# Patient Record
Sex: Male | Born: 1945 | Race: Black or African American | Hispanic: No | Marital: Married | Smoking: Never smoker
Health system: Southern US, Community
[De-identification: ages and names within clinical notes are randomized; demographics above are authoritative.]

## PROBLEM LIST (undated history)

## (undated) DIAGNOSIS — I10 Essential (primary) hypertension: Secondary | ICD-10-CM

## (undated) DIAGNOSIS — N493 Fournier gangrene: Principal | ICD-10-CM

## (undated) DIAGNOSIS — L988 Other specified disorders of the skin and subcutaneous tissue: Secondary | ICD-10-CM

## (undated) DIAGNOSIS — H547 Unspecified visual loss: Secondary | ICD-10-CM

## (undated) HISTORY — DX: Unspecified visual loss: H54.7

## (undated) HISTORY — DX: Other specified disorders of the skin and subcutaneous tissue: L98.8

## (undated) HISTORY — PX: EYE SURGERY: SHX253

## (undated) HISTORY — DX: Essential (primary) hypertension: I10

## (undated) HISTORY — PX: HEMORRHOID SURGERY: SHX153

## (undated) HISTORY — DX: Fournier gangrene: N49.3

## (undated) HISTORY — PX: OTHER SURGICAL HISTORY: SHX169

## (undated) HISTORY — PX: RETINAL DETACHMENT SURGERY: SHX105

---

## 2010-12-15 ENCOUNTER — Ambulatory Visit (HOSPITAL_COMMUNITY): Admission: AD | Admit: 2010-12-15 | Payer: Self-pay | Source: Ambulatory Visit | Admitting: Surgery

## 2010-12-15 ENCOUNTER — Inpatient Hospital Stay (HOSPITAL_COMMUNITY)
Admission: EM | Admit: 2010-12-15 | Discharge: 2010-12-26 | DRG: 348 | Disposition: A | Discharge status: Home Health | Payer: Medicare Other | Attending: Pulmonary Disease | Admitting: Pulmonary Disease

## 2010-12-15 ENCOUNTER — Other Ambulatory Visit: Payer: Self-pay | Admitting: Surgery

## 2010-12-15 ENCOUNTER — Emergency Department (HOSPITAL_COMMUNITY): Payer: Medicare Other

## 2010-12-15 DIAGNOSIS — D6489 Other specified anemias: Secondary | ICD-10-CM | POA: Diagnosis not present

## 2010-12-15 DIAGNOSIS — E119 Type 2 diabetes mellitus without complications: Secondary | ICD-10-CM | POA: Diagnosis present

## 2010-12-15 DIAGNOSIS — E669 Obesity, unspecified: Secondary | ICD-10-CM | POA: Diagnosis present

## 2010-12-15 DIAGNOSIS — K612 Anorectal abscess: Principal | ICD-10-CM | POA: Diagnosis present

## 2010-12-15 DIAGNOSIS — N183 Chronic kidney disease, stage 3 unspecified: Secondary | ICD-10-CM | POA: Diagnosis present

## 2010-12-15 DIAGNOSIS — N501 Vascular disorders of male genital organs: Secondary | ICD-10-CM | POA: Diagnosis present

## 2010-12-15 DIAGNOSIS — H543 Unqualified visual loss, both eyes: Secondary | ICD-10-CM | POA: Diagnosis present

## 2010-12-15 DIAGNOSIS — I129 Hypertensive chronic kidney disease with stage 1 through stage 4 chronic kidney disease, or unspecified chronic kidney disease: Secondary | ICD-10-CM | POA: Diagnosis present

## 2010-12-15 DIAGNOSIS — N179 Acute kidney failure, unspecified: Secondary | ICD-10-CM | POA: Diagnosis present

## 2010-12-15 LAB — DIFFERENTIAL
Basophils Absolute: 0 10*3/uL (ref 0.0–0.1)
Basophils Relative: 0 % (ref 0–1)
Lymphocytes Relative: 4 % — ABNORMAL LOW (ref 12–46)
Monocytes Relative: 6 % (ref 3–12)
Neutro Abs: 23.9 10*3/uL — ABNORMAL HIGH (ref 1.7–7.7)
Neutrophils Relative %: 90 % — ABNORMAL HIGH (ref 43–77)

## 2010-12-15 LAB — HEPATIC FUNCTION PANEL
ALT: 33 U/L (ref 0–53)
AST: 35 U/L (ref 0–37)
Albumin: 2.4 g/dL — ABNORMAL LOW (ref 3.5–5.2)
Alkaline Phosphatase: 96 U/L (ref 39–117)
Indirect Bilirubin: 0.2 mg/dL — ABNORMAL LOW (ref 0.3–0.9)
Total Protein: 6.5 g/dL (ref 6.0–8.3)

## 2010-12-15 LAB — COMPREHENSIVE METABOLIC PANEL
Albumin: 2.3 g/dL — ABNORMAL LOW (ref 3.5–5.2)
Alkaline Phosphatase: 102 U/L (ref 39–117)
BUN: 39 mg/dL — ABNORMAL HIGH (ref 6–23)
CO2: 23 mEq/L (ref 19–32)
Chloride: 88 mEq/L — ABNORMAL LOW (ref 96–112)
Creatinine, Ser: 2.34 mg/dL — ABNORMAL HIGH (ref 0.4–1.5)
GFR calc non Af Amer: 28 mL/min — ABNORMAL LOW (ref >60)
Potassium: 3.6 mEq/L (ref 3.5–5.1)
Total Bilirubin: 0.5 mg/dL (ref 0.3–1.2)

## 2010-12-15 LAB — BASIC METABOLIC PANEL
Chloride: 86 mEq/L — ABNORMAL LOW (ref 96–112)
GFR calc Af Amer: 29 mL/min — ABNORMAL LOW (ref >60)
GFR calc non Af Amer: 24 mL/min — ABNORMAL LOW (ref >60)
Potassium: 3.5 mEq/L (ref 3.5–5.1)
Sodium: 126 mEq/L — ABNORMAL LOW (ref 135–145)

## 2010-12-15 LAB — LACTIC ACID, PLASMA: Lactic Acid, Venous: 6.3 mmol/L — ABNORMAL HIGH (ref 0.5–2.2)

## 2010-12-15 LAB — CBC
HCT: 35.3 % — ABNORMAL LOW (ref 39.0–52.0)
Hemoglobin: 12.3 g/dL — ABNORMAL LOW (ref 13.0–17.0)
MCHC: 34.8 g/dL (ref 30.0–36.0)
RBC: 4.45 MIL/uL (ref 4.22–5.81)

## 2010-12-15 LAB — URINALYSIS, ROUTINE W REFLEX MICROSCOPIC
Glucose, UA: 100 mg/dL — AB
Nitrite: NEGATIVE
Specific Gravity, Urine: 1.022 (ref 1.005–1.030)
pH: 5 (ref 5.0–8.0)

## 2010-12-15 LAB — GLUCOSE, CAPILLARY: Glucose-Capillary: 262 mg/dL — ABNORMAL HIGH (ref 70–99)

## 2010-12-16 LAB — GLUCOSE, CAPILLARY: Glucose-Capillary: 254 mg/dL — ABNORMAL HIGH (ref 70–99)

## 2010-12-16 LAB — CBC
HCT: 30.3 % — ABNORMAL LOW (ref 39.0–52.0)
MCH: 27.1 pg (ref 26.0–34.0)
MCV: 78.1 fL (ref 78.0–100.0)
RBC: 3.88 MIL/uL — ABNORMAL LOW (ref 4.22–5.81)
RDW: 13.5 % (ref 11.5–15.5)
WBC: 23.1 10*3/uL — ABNORMAL HIGH (ref 4.0–10.5)

## 2010-12-16 LAB — BASIC METABOLIC PANEL
BUN: 42 mg/dL — ABNORMAL HIGH (ref 6–23)
CO2: 23 mEq/L (ref 19–32)
Glucose, Bld: 207 mg/dL — ABNORMAL HIGH (ref 70–99)
Potassium: 3.6 mEq/L (ref 3.5–5.1)
Sodium: 126 mEq/L — ABNORMAL LOW (ref 135–145)

## 2010-12-17 LAB — GLUCOSE, CAPILLARY
Glucose-Capillary: 156 mg/dL — ABNORMAL HIGH (ref 70–99)
Glucose-Capillary: 193 mg/dL — ABNORMAL HIGH (ref 70–99)
Glucose-Capillary: 211 mg/dL — ABNORMAL HIGH (ref 70–99)
Glucose-Capillary: 223 mg/dL — ABNORMAL HIGH (ref 70–99)

## 2010-12-17 LAB — URINE CULTURE
Colony Count: NO GROWTH
Culture  Setup Time: 201205050056

## 2010-12-17 LAB — BASIC METABOLIC PANEL
Chloride: 100 mEq/L (ref 96–112)
Creatinine, Ser: 1.48 mg/dL (ref 0.4–1.5)
GFR calc Af Amer: 58 mL/min — ABNORMAL LOW (ref >60)
Potassium: 3.7 mEq/L (ref 3.5–5.1)

## 2010-12-17 LAB — CBC
Platelets: 240 10*3/uL (ref 150–400)
RBC: 3.76 MIL/uL — ABNORMAL LOW (ref 4.22–5.81)
WBC: 16 10*3/uL — ABNORMAL HIGH (ref 4.0–10.5)

## 2010-12-18 LAB — GLUCOSE, CAPILLARY
Glucose-Capillary: 248 mg/dL — ABNORMAL HIGH (ref 70–99)
Glucose-Capillary: 206 mg/dL — ABNORMAL HIGH (ref 70–99)

## 2010-12-19 LAB — CREATININE, SERUM
Creatinine, Ser: 1.51 mg/dL — ABNORMAL HIGH (ref 0.4–1.5)
GFR calc non Af Amer: 47 mL/min — ABNORMAL LOW (ref >60)

## 2010-12-19 LAB — CULTURE, ROUTINE-ABSCESS
Culture: NORMAL
Gram Stain: NONE SEEN

## 2010-12-19 LAB — GLUCOSE, CAPILLARY: Glucose-Capillary: 201 mg/dL — ABNORMAL HIGH (ref 70–99)

## 2010-12-19 LAB — VANCOMYCIN, TROUGH: Vancomycin Tr: 23.3 ug/mL — ABNORMAL HIGH (ref 10.0–20.0)

## 2010-12-19 NOTE — Op Note (Signed)
  NAME:  Jose Roberts, Jose Roberts NO.:  [id number]  MEDICAL RECORD NO.:  [id number]           PATIENT TYPE:  I  LOCATION:  1523                         FACILITY:  [facility]  PHYSICIAN:  Sandria Bales. Ezzard Standing, M.D.  DATE OF BIRTH:  Jan 28, 1946  DATE OF PROCEDURE:  12/18/2010                              OPERATIVE REPORT   PREOPERATIVE DIAGNOSIS:  Perirectal/perianal abscess - Fournier gangrene.  POSTOPERATIVE DIAGNOSIS:  Perirectal/perianal abscess - Fournier gangrene.  PROCEDURE:  Incision and drainage of abscess.  SURGEON:  Sandria Bales. Ezzard Standing, MD  ANESTHESIA:  8 cc of 2% Xylocaine.  COMPLICATIONS:  None.  INDICATIONS FOR PROCEDURE:  Jose Roberts is a 65 year old black male who has diabetes, presented with a perianal/buttocks abscess (Fournier gangrene) and required an incision and drainage by Dr. Michaell Cowing on Dec 15, 2010.  He is now at [facility].  He still has some areas that need to be opened up more widely and I discussed this with him.  We will do this at the bedside.  OPERATIVE NOTE: With the patient in prone position, his perineum was prepped with Betadine solution.  I infiltrated about 8 cc to 2% Xylocaine.  I opened on the right side a tissue bridge that was about 3-4 cm long and on the left side, I extended an incision more anterior.  He does have a connection between the left side which is a smaller incision to the large right side incision with a bridging skin posteriorly, but I left this alone for now.  I then packed it with saline gauze and will continue his daily dressing changes at bedside.  Sandria Bales. Ezzard Standing, M.D., FACS   DHN/MEDQ  D:  12/18/2010  T:  12/19/2010  Job:  [id number]  cc:   Dr. Mart Piggs  Electronically Signed by Ovidio Kin M.D. on 12/19/2010 10:30:24 AM

## 2010-12-20 LAB — ANAEROBIC CULTURE

## 2010-12-20 LAB — GLUCOSE, CAPILLARY
Glucose-Capillary: 203 mg/dL — ABNORMAL HIGH (ref 70–99)
Glucose-Capillary: 121 mg/dL — ABNORMAL HIGH (ref 70–99)
Glucose-Capillary: 91 mg/dL (ref 70–99)

## 2010-12-20 NOTE — Op Note (Signed)
  NAME:  Jose Roberts, Jose Roberts NO.:  [id number]  MEDICAL RECORD NO.:  [id number]           PATIENT TYPE:  I  LOCATION:  1523                         FACILITY:  [facility]  PHYSICIAN:  Sandria Bales. Ezzard Standing, M.D.  DATE OF BIRTH:  Oct 01, 1945  DATE OF PROCEDURE: 19 Dec 2010                              OPERATIVE REPORT   PREOPERATIVE DIAGNOSIS:  Perirectal and buttocks abscess/Fournier gangrene.  POSTOPERATIVE DIAGNOSIS:  Perirectal and buttocks abscess/Fournier gangrene.  PROCEDURE:  I and D of abscess.  SURGEON:  Sandria Bales. Ezzard Standing, M.D.  ANESTHESIA:  9 cc of 2% Xylocaine.  COMPLICATIONS:  None.  INDICATIONS FOR PROCEDURE:  Mr. Baranowski is a 65 year old black male who sees Dr. Loyal Jacobson, who was admitted by Dr. Karie Soda on May 4 and required an incision and drainage of a perirectal buttocks abscess. This looks like a Fournier gangrene.  He still has some tunneling under some of the flaps, so at the bedside I am going to do wider incision and drainage of this abscess.  OPERATIVE NOTE:  The patient in prone position.  His perineum was prepped with Betadine solution and infiltrated about 9 cc of 2% Xylocaine.  Along the left buttocks wound, I opened an area about another 4 cm going cranial and tried to clean up some of this tissue.  I then packed this with saline gauze.  The patient tolerated the procedure well.  His wife was in the room when I did this, and she has nursing background and understands the significance of the infection.   Sandria Bales. Ezzard Standing, M.D., FACS  DHN/MEDQ  D:  12/19/2010  T:  12/20/2010  Job:  [id number]  cc:   Sid Falcon, M.D.  Electronically Signed by Ovidio Kin M.D. on 12/20/2010 11:40:14 AM

## 2010-12-21 LAB — CULTURE, BLOOD (ROUTINE X 2)
Culture  Setup Time: 201205041416
Culture: NO GROWTH

## 2010-12-21 LAB — GLUCOSE, CAPILLARY

## 2010-12-21 LAB — HEMOGLOBIN A1C
Hgb A1c MFr Bld: 8 % — ABNORMAL HIGH (ref <5.7)
Mean Plasma Glucose: 183 mg/dL — ABNORMAL HIGH (ref <117)

## 2010-12-22 LAB — BASIC METABOLIC PANEL
BUN: 15 mg/dL (ref 6–23)
Calcium: 9.3 mg/dL (ref 8.4–10.5)
Chloride: 100 mEq/L (ref 96–112)
Creatinine, Ser: 2 mg/dL — ABNORMAL HIGH (ref 0.4–1.5)
GFR calc Af Amer: 41 mL/min — ABNORMAL LOW (ref >60)
GFR calc non Af Amer: 34 mL/min — ABNORMAL LOW (ref >60)

## 2010-12-22 LAB — DIFFERENTIAL
Basophils Absolute: 0 10*3/uL (ref 0.0–0.1)
Eosinophils Absolute: 0.1 10*3/uL (ref 0.0–0.7)
Lymphs Abs: 1.7 10*3/uL (ref 0.7–4.0)
Monocytes Relative: 9 % (ref 3–12)

## 2010-12-22 LAB — CBC
MCH: 27.3 pg (ref 26.0–34.0)
MCHC: 33.8 g/dL (ref 30.0–36.0)
Platelets: 360 10*3/uL (ref 150–400)
RBC: 2.64 MIL/uL — ABNORMAL LOW (ref 4.22–5.81)
RDW: 14 % (ref 11.5–15.5)

## 2010-12-22 LAB — GLUCOSE, CAPILLARY: Glucose-Capillary: 145 mg/dL — ABNORMAL HIGH (ref 70–99)

## 2010-12-23 LAB — BASIC METABOLIC PANEL
CO2: 23 mEq/L (ref 19–32)
Calcium: 9.1 mg/dL (ref 8.4–10.5)
Creatinine, Ser: 2.11 mg/dL — ABNORMAL HIGH (ref 0.4–1.5)
GFR calc Af Amer: 39 mL/min — ABNORMAL LOW (ref >60)
GFR calc non Af Amer: 32 mL/min — ABNORMAL LOW (ref >60)

## 2010-12-23 LAB — GLUCOSE, CAPILLARY
Glucose-Capillary: 137 mg/dL — ABNORMAL HIGH (ref 70–99)
Glucose-Capillary: 79 mg/dL (ref 70–99)

## 2010-12-23 LAB — CBC
Hemoglobin: 7.4 g/dL — ABNORMAL LOW (ref 13.0–17.0)
MCH: 26.7 pg (ref 26.0–34.0)
MCHC: 33.2 g/dL (ref 30.0–36.0)
Platelets: 477 10*3/uL — ABNORMAL HIGH (ref 150–400)
RDW: 14 % (ref 11.5–15.5)

## 2010-12-24 LAB — GLUCOSE, CAPILLARY
Glucose-Capillary: 131 mg/dL — ABNORMAL HIGH (ref 70–99)
Glucose-Capillary: 119 mg/dL — ABNORMAL HIGH (ref 70–99)

## 2010-12-24 LAB — BASIC METABOLIC PANEL
CO2: 25 mEq/L (ref 19–32)
Calcium: 9.4 mg/dL (ref 8.4–10.5)
GFR calc Af Amer: 40 mL/min — ABNORMAL LOW (ref >60)
GFR calc non Af Amer: 33 mL/min — ABNORMAL LOW (ref >60)
Sodium: 135 mEq/L (ref 135–145)

## 2010-12-24 LAB — CBC
Hemoglobin: 7.1 g/dL — ABNORMAL LOW (ref 13.0–17.0)
MCHC: 32.9 g/dL (ref 30.0–36.0)
RDW: 14.5 % (ref 11.5–15.5)

## 2010-12-25 LAB — BASIC METABOLIC PANEL
CO2: 23 mEq/L (ref 19–32)
Chloride: 99 mEq/L (ref 96–112)
GFR calc Af Amer: 39 mL/min — ABNORMAL LOW (ref >60)
Potassium: 4 mEq/L (ref 3.5–5.1)
Sodium: 133 mEq/L — ABNORMAL LOW (ref 135–145)

## 2010-12-26 LAB — BASIC METABOLIC PANEL
CO2: 23 mEq/L (ref 19–32)
Chloride: 100 mEq/L (ref 96–112)
Glucose, Bld: 219 mg/dL — ABNORMAL HIGH (ref 70–99)
Potassium: 3.8 mEq/L (ref 3.5–5.1)
Sodium: 133 mEq/L — ABNORMAL LOW (ref 135–145)

## 2010-12-26 NOTE — Op Note (Signed)
NAME:  Jose Roberts, FULTS NO.:  [id number]  MEDICAL RECORD NO.:  [id number]           PATIENT TYPE:  I  LOCATION:  1523                         FACILITY:  [facility]  PHYSICIAN:  Ardeth Sportsman, MD     DATE OF BIRTH:  04/10/1946  DATE OF PROCEDURE:  12/15/2010 DATE OF DISCHARGE:                              OPERATIVE REPORT   PRIMARY CARE PHYSICIAN:  Dr. Loyal Jacobson, [facility].  REFERRING PHYSICIAN:  Devoria Albe, M.D.  SURGEON:  Ardeth Sportsman, MD  PREOPERATIVE DIAGNOSES: 1. Large right perirectal abscess. 2. Possible Fournier gangrene.  POSTOPERATIVE DIAGNOSIS:  Fournier's gangrene.  PROCEDURE:  Incision and drainage of necrotic perineum into pelvis.  ANESTHESIA: 1. General anesthesia. 2. Local anesthetic and a field block.  SPECIMENS:  Necrotic skin, fat, muscle of perineum and buttocks for pathology as well as culture.  DRAINS:  None.  ESTIMATED BLOOD LOSS:  150 mL.  COMPLICATIONS:  None major.  INDICATIONS:  Jose Roberts is a 65 year old male, diabetic with a history of hemorrhoidectomies and rectal fistula.  These surgeries were in the 1990s.  He has not had any major problems.  However, 4 days ago, he started having anal pain.  He thought it was a  hemorrhoid flare.  He went over to [facility].  They recommended followup over [facility].  He went to [facility] 2 days later.  They thought the area may have burst.  They recommended that he see a surgeon in the following week.  Two days later (today), the pain became so intense that his wife brought him to the emergency room.  Based on evaluation today, they did x-rays and noted gas in his perineum and pelvis and recommended surgical evaluation.  We ordered a white count and it was markedly elevated at 26.  His lactate was elevated.  His glucose was elevated.   Surgery was consulted.  He had obvious involvement of the right half of his perineum to his gluteus coming up to his  scrotum with very foul smell.  We have recommended urgent IV antibiotics and surgical debridement. Technique was discussed.  Risks, benefits, and alternatives were discussed.  He and his wife agreed to proceed.  OPERATIVE FINDINGS:  He had extensive Fournier gangrene & necrosis of the skin and especially the fatty soft tissues of the right perineum. It tract up into the base of the right scrotum.  It tunneled along the right lateral sphincter muscle into the true pelvis on the right lateral aspect.  It also tunneled posteriorly onto the posterior buttocks and up towards the coccyx.  It also tract over from the right posterior to the left posterior inner buttocks.  The rectum seemed viable.  DESCRIPTION OF PROCEDURE:  Informed consent was confirmed.  The patient had already received IV vancomycin and piperacillin/tazobactam in the ER en route.  He underwent general anesthesia without any difficulty.  He was positioned high lithotomy.  His perineum was prepped and draped in sterile fashion.  Surgical time-out confirmed our plan.  I used an 18-gauge needle to try and aspirate frank pus, but I could not.  He already had a 5 x 5 cm area of black necrotic wet gangrene skin.  I took a scalpel and removed all of that.  We encountered extremely foul odor with gray brown necrotic fat that was seeping pus in numerous tissue planes.  I can bring my finger and easily sweep up to the base of the scrotum.  I can take my next finger and push all the way down the gluteus towards the coccyx.  I therefore extended the incision to about 15 cm to make sure everything was wide open.  I did a sharp dissection with Mayo scissors to remove all frank dead necrotic fat.  I trimmed back the skin and I took an extra centimeter of non-bleeding necrotic skin laterally and medially until I finally got a good bleeding skin as the skin was extremely edematous and ischemic and necrotic until it actually had bleeding  tissue.  I did further debridement up and towards the scrotum until I removed all dead tissue until I have some healthy bleeding fat and tissue.  In following it, there was necrotic tissue moving down into the pelvic floor muscles on the right, and I could easily put my fingers through there and encountered a deeper pocket more superior to that running along the right rectal wall into the true pelvis.  Fortunately, it is pus there but there was not any necrosis of the rectum itself.  I did track a little anteriorly as well, but I freed until I could debride all tissue.  Posteriorly, I could flip down and go into the true posterior buttocks and noted that I could go across midline in the intergluteal fold over the left side and released another large pocket.  Because these were rather posterior, I did counter incisions of 4 cm on the medial posterior buttocks and the mid-intergluteal cleft on both sides.  Using a speculum and going down about 10 cm deep, I could find some necrotic tissue and debrided that off.  I did copious irrigation with pulse lavage of over 3 L.  The areas had much pinked up.  There was healthy bleeding in all the bases.  I used cautery to help ensure hemostasis.  Therefore, I took a rolled gauze 4- inch Kerlix and put saline with light dose of Betadine and then packed all 3 open wounds.  Hemostasis was good.  The patient was extubated and taken to recovery room in stable condition.  I am about to discuss the operative findings to the patient's family. We will follow the wound closely.  He may require operative re- debridement in the next 24 to 72 hours depending how his clinical course goes.     Ardeth Sportsman, MD     SCG/MEDQ  D:  12/15/2010  T:  12/16/2010  Job:  [id number]  cc:   [facility]  [facility]  Electronically Signed by Karie Soda MD on 12/26/2010 12:25:51 PM

## 2011-01-23 NOTE — H&P (Signed)
NAME:  Jose Roberts, STREGE NO.:  [id number]  MEDICAL RECORD NO.:  [id number]           PATIENT TYPE:  E  LOCATION:  WLED                         FACILITY[facility]  [facility]  PHYSICIAN:  Ardeth Sportsman, MD     DATE OF BIRTH:  [birth year]  DATE OF ADMISSION:  12/15/2010 DATE OF DISCHARGE:                             HISTORY & PHYSICAL   CHIEF COMPLAINT:  Fever and rectal bleeding.  PRIMARY CARE DOCTOR:  Dr. Loyal Jacobson, [facility].  BRIEF HISTORY:  The patient is a 65 year old gentleman who has been blind since age 44.  He had some discomfort in his perirectal area back in Saturday and thought it was his hemorrhoids.  Monday p.m. by the time he left work, it was significantly worse and had the driver take him to a [facility].  From there, he was scheduled to see someone at [facility] on Wednesday p.m.  There, the patient reports that he has had a "burst."  He was seen and he was being scheduled to see a perirectal surgeon this coming Tuesday, Dec 19, 2010.  The patient has continued to have ongoing swelling and progressive discomfort.  It has got to the point where he cannot sit, it has gotten worse each day.  He got up at 5 a.m. and tried a sitz baths, developed nausea and vomiting, vomited 2 times and subsequently was brought to the ER by his wife.  PAST MEDICAL HISTORY: 1. He has been blind since age 20. 2. Diabetes. 3. Hypertension. 4. Overweight, height 71 inches, weights 261 pounds.  PAST SURGICAL HISTORY:  He had a rectal fistula about 16 years ago.  He has had hemorrhoids twice with some kind of revision by Dr. Lavell Islam about 1995.  He has had surgeries on his eyes in the past to remove cataracts but this did not help his vision.  FAMILY HISTORY:  Father died of coronary artery disease, diabetes, and hypertension in his 85s.  Mother died in their late 35s with cancer, diabetes, and high blood pressure.  He has 3 brothers, 5  sisters. Together they have multiple members have diabetes, some with high blood pressure and some with thyroid issues.  SOCIAL HISTORY:  Tobacco:  Never.  Alcohol:  8-10 beers per week at most, usually not that much.  Drugs:  None.  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  REVIEW OF SYSTEMS:  GENERAL:  Fever, he thinks he has had a fever but he has not recorded.  Weight, he says is stable. SKIN:  No changes except for his buttocks. PSYCH:  No changes. PULMONARY:  He had a cold symptoms last week.  Pulmonary is otherwise normal.  No coughing or wheezing.  No PND.  No dyspnea on exertion. CARDIAC:  No history of chest pain. GASTROINTESTINAL:  Positive for some GERD.  Positive for some constipation, it is why we have actually treated him with an enema on Wednesday and he had some laxatives yesterday.  He has not had a bowel movement since the enema on Wednesday. GENITOURINARY:  He describes difficulty  starting his stream.  This is ongoing for some time. LOWER EXTREMITIES:  No edema.  No claudication. MUSCULOSKELETAL:  No significant arthritis.  CURRENT MEDICATIONS:  Vicodin p.r.n., Bactrim 1 b.i.d., glipizide 10 mg daily, lisinopril 40 mg daily, Norvasc 10 mg daily, hydrochlorothiazide at one place is listed at 50 mg daily.  ALLERGIES:  None known.  PHYSICAL EXAM:  GENERAL:  This is a well nourished, overweight African American male, in no acute distress. VITAL SIGNS:  Temperature is 97.3 in the ER, his heart rate was 123 on admission, blood pressure 112/66, respiratory rate is 24, sats 99% on room air. HEENT:  Head:  Normocephalic.  Eyes:  He is blind bilaterally.  Ears, nose, throat, and mouth are grossly within normal limits. NECK:  Trachea is in the midline.  Thyroid was not palpable.  There are no bruits. CARDIAC:  Normal S1 and S2.  He is tachycardic.  Pulses are +2 and equal in upper and lower extremities. CHEST:  Nontender. ABDOMEN:  Soft, nontender.   Positive bowel sounds.  No hernia, masses, or abscesses. RECTAL:  A large indurated area which is swollen and raised around the rectum.  It is a Careers adviser.  It extends and goes towards the anterior perineum, especially on the right side.  On the right buttock, there is a large area about an inch and a half in diameter that was thought to be a cyst which is partially draining but was infected. SKIN:  Skin distal to this was normal in the buttocks.  I did not do a rectal exam secondary to tenderness.  No decubitus. NEUROLOGIC:  No focal deficits.  Patient is able to stand and moving without difficulty.  Cranial nerves except for his vision are grossly intact. PSYCH:  Normal affect.  LABORATORY DATA:  Labs so far, white count is 26,600, hemoglobin is 12.3, hematocrit 35, platelets 228,000, lactic acid is 6.3.  Three view abdomen shows gas and soft tissue in inferior pubic sepsis, possibly in the perineum and perirectal tissue.  At this point, he has been started on fluids and has IV vancomycin and Zosyn ordered.  IMPRESSION: 1. Perirectal infection, probable Fournier's Gangrene. 2. Blind. 3. Adult onset diabetes mellitus. 4. Hypertension. 5. History of hemorrhoids. 6. 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  PLAN:  We will hydrate the patient.  He has been started on antibiotics. More labs are pending.  We plan to take him emergently to the OR for incision and drainage with further treatment and debridement as needed.     Eber Hong, P.A.   ______________________________ Ardeth Sportsman, MD    WDJ/MEDQ  D:  12/15/2010  T:  12/15/2010  Job:  [id number]  cc:   Dr. Loyal Jacobson  Electronically Signed by Sherrie George P.A. on 12/31/2010 08:27:06 PM Electronically Signed by Karie Soda MD on 01/23/2011 03:51:04 PM

## 2011-02-14 NOTE — Consult Note (Signed)
NAME:  Jose Roberts, Jose Roberts NO.:  [id number]  MEDICAL RECORD NO.:  [id number]           PATIENT TYPE:  I  LOCATION:  1523                         [facility]AC[facility]  PHYSICIAN:  Zannie Cove, MD     DATE OF BIRTH:  [birth year]  DATE OF CONSULTATION: DATE OF DISCHARGE:                                CONSULTATION   PRIMARY CARE PHYSICIAN:  Dr. Loyal Jacobson, [facility].  REASON FOR CONSULTATION:  Uncontrolled diabetes.  PHYSICIAN REQUESTING THE CONSULT:  Sandria Bales. Ezzard Standing, MD  HISTORY OF PRESENT ILLNESS:  Mr. Pitera is a very pleasant 65 year old African American diabetic gentleman who was admitted to the surgical service with a perirectal abscess found to have Fournier's gangrene, is status post incision and drainage on Dec 19, 2010 and again repeat I and D on Dec 20, 2010.  Currently postop day #2, and being treated currently with IV vancomycin and Zosyn as well as hydrotherapy for his wound. [facility] hospitalist were consulted for his diabetes management, currently on Glucotrol in addition to this, he takes metformin 2000 mg at bedtime, but has never been on insulin previously except for a sliding scale over here.  However at this moment currently on a D5 normal saline drip at 60 mL an hour.  PAST MEDICAL HISTORY:  Significant for 1. Diabetes. 2. Hypertension. 3. History of hemorrhoids. 4. History of perirectal fistula. 5. Blindness. 6. Obesity.  PAST SURGICAL HISTORY:  Significant for hemorrhoid surgery in 1995 and cataract surgeries.  CURRENT MEDICATIONS:  In e-chart review include hydrochlorothiazide, glipizide, D5 half normal saline, lisinopril, Zosyn, vancomycin, simvastatin, MiraLax, Norvasc and other p.r.n. medications.  ALLERGIES:  No known drug allergies.  SOCIAL HISTORY:  Previously in an [employer].  No history of illicit drug use or tobacco use.  Drinks alcohol occasionally, several beers a  week.  FAMILY HISTORY:  Significant for coronary disease in his father and diabetes in his father as well.  Mother deceased in her 30s with cancer, diabetes and high blood pressure.  REVIEW OF SYSTEMS:  A 12-system review is negative except per HPI.  PHYSICAL EXAMINATION:  VITAL SIGNS:  Temperature is 98, pulse 94, blood pressure 143/76, respirations 18, satting 97% room air. GENERAL:  A very pleasant Caucasian gentleman lying in bed in no acute distress. HEENT:  Keeps his eyes close due to blindness.  Oral mucosa is moist and pink. NECK:  No JVD or lymphadenopathy. CARDIOVASCULAR SYSTEM:  S1, S2, regular rate and rhythm. LUNGS:  Clear to auscultation bilaterally. ABDOMEN:  Soft, nontender, positive bowel sounds.  No organomegaly. EXTREMITIES:  No edema, clubbing or cyanosis.  Rectal area with dressing.  ASSESSMENT AND PLAN:  Diabetes.  We will discontinue his D5 half normal saline and start him on half normal saline at 65.  Continue the Glucotrol for now.  We will check BMET, keep him on sensitive NovoLog sliding scale and check a hemoglobin A1c long-term.  He would probably not be a very good candidate for insulin due to the fact that he is blind; however, he may be able to hold off  on this on long-acting insulin if his sugars stabilize after stopping his dextrose.  When his kidney function remained stable and he is closer to discharge, we can also restart him back on his metformin.  We will follow the patient with you.  Thank you for the consult.     Zannie Cove, MD     PJ/MEDQ  D:  12/21/2010  T:  12/21/2010  Job:  [id number]  Electronically Signed by Zannie Cove  on 02/14/2011 02:12:46 PM

## 2011-02-19 ENCOUNTER — Ambulatory Visit (INDEPENDENT_AMBULATORY_CARE_PROVIDER_SITE_OTHER): Payer: Medicare Other | Admitting: Surgery

## 2011-02-19 ENCOUNTER — Encounter (INDEPENDENT_AMBULATORY_CARE_PROVIDER_SITE_OTHER): Payer: Self-pay | Admitting: Surgery

## 2011-02-19 DIAGNOSIS — N493 Fournier gangrene: Secondary | ICD-10-CM

## 2011-02-19 DIAGNOSIS — N501 Vascular disorders of male genital organs: Secondary | ICD-10-CM

## 2011-02-19 HISTORY — DX: Fournier gangrene: N49.3

## 2011-02-19 NOTE — Progress Notes (Signed)
Subjective:     Patient ID: Jose Roberts, male   DOB: 1946-03-19, 65 y.o.   MRN: [id number]    There were no vitals taken for this visit.    HPI  Diagnosis: Fournier gangrene  Procedures excision debridement on May 4/7/8, 2012  Reason for visit: Followup  Patient comes a feeling better. His wife is here. Continues dressing changes every day. His soreness is going down. His glucose has been in good control. He's not having problems with constipation or diarrhea.  Review of Systems  per HPI.    Objective:   Physical Exam  Constitutional: He appears well-developed and well-nourished.  Eyes:       Legally blind - unchanged  Neck: Normal range of motion.  Cardiovascular: Normal rate and intact distal pulses.   Pulmonary/Chest: Effort normal. No respiratory distress.  Abdominal: Soft. He exhibits no distension. There is no tenderness.  Genitourinary:       Left post perineal wound 5x1.5x1cm.  Right post perineal 8x2x2cm.  Great granulation, closing        Assessment:     Fournier's gangrene with numerous debridements, wound slowly healing.    Plan:     Continue daily dressing changes  9 Vioxx  Notes recurrence  Allowed to heal by secondary intention. Patient and wife wondered if I could just stitch the wound closed. NI warned him this could start the process all over again. Molli Knock to return to work after Labor Day. He has to sit for the entire day and work. At that point the wound should be closed.  Return to clinic in 4 weeks for wound check this

## 2011-02-19 NOTE — Patient Instructions (Signed)
Continue excellent wound care  Okay to wait until 04Sep2012 to return to work

## 2011-02-20 NOTE — Discharge Summary (Signed)
NAME:  Jose Roberts, FRASIER NO.:  [id number]  MEDICAL RECORD NO.:  [id number]           PATIENT TYPE:  I  LOCATION:  1523                         FACILITY:  [facility]  PHYSICIAN:  Lennie Muckle, MD      DATE OF BIRTH:  [birth year]  DATE OF ADMISSION:  12/15/2010 DATE OF DISCHARGE:  12/26/2010                              DISCHARGE SUMMARY   HISTORY OF PRESENT ILLNESS:  Mr. 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  He presented with significant discomfort in the perirectal area and thought it was hemorrhoids.  However, this significantly worsened and apparently reported some spontaneous drainage.  Therefore, he presented to the emergency department as the patient became more febrile and actually developed some nausea and vomiting.  Upon presentation to the emergency department, workup including labs and imaging including were significantly abnormal.  His white blood cell count on admission was noted to 26,000, and the abdominal imaging showed gas in the soft tissues and the inferior pubic sepsis, proximally in the perineum and perirectal region.  The patient was admitted for IV antibiotics and probable surgical intervention.  SUMMARY OF HOSPITAL COURSE:  The patient was admitted on Dec 15, 2010, was immediately placed on IV vancomycin and Zosyn per pharmacy recommendations.  The patient's admission creatinine was quite elevated at 2.69.  However, with no prior admissions or records at this facility, unable to know the patient's baseline creatinine is.  The patient was taken to the operating room on October 5th, and underwent incision and drainage of necrotic perineum into the pelvis consistent with Fournier gangrene.  He was subsequently taken back on May 8th as well as on May 9th for repeated incision and drainage and packing change due to the complexity of the wound and toleration  of pain by the patient.  However, after the third procedure, the patient seemed to be significantly better as far as pain control and the tissue was much more viable.  Gradually the packing and the dressing changes were able to be done at bedside.  The physical therapist were able to continue using some hydrotherapy for additional debridement of the wound.  The patient's diabetes, however, was found to be uncontrolled during the course of the patient's stay.  His hemoglobin A1c was elevated at 8.0. Again no baseline labs as this patient was new to this facility. Therefore the [facility] were asked to consult on the patient and adjusted his medications.  Given his acute renal insufficiency secondary to the infectious course, the metformin was initially started but then discontinued.  He has also been taken off his lisinopril and ibuprofen as these were both thought to be contributing to his acute renal insufficiency in addition to the infectious process.  He had been switched to Prandin 0.5 mg twice daily in addition to his glipizide which has provided good control while he is here.  In addition, he has been placed on a sliding scale as a precaution.  His creatinine has stabilized postoperatively to a level of approximately two  with a GFR ranging around 40.  Again uncertain whether this patient had some baseline renal insufficiency secondary to his diabetes, but he appears to be at baseline as his creatinine and GFR have been stable for several days, and he has maintained adequate and good urine output.  Therefore, we feel the patient is stable for discharge at this point in time with continued home health management.  DISCHARGE DIAGNOSES: 1. Fournier gangrene status post extensive debridement posteriorly to     bilateral buttock regions. 2. Acute on probable chronic renal insufficiency - stable. 3. Diabetes mellitus - stable. 4. Hypertension - stable.  DISCHARGE  MEDICATIONS:  The patient's home medications have altered and will now be as follows: 1. Prandin 0.5 mg 1 tablet twice daily. 2. Amlodipine 10 mg 1 tablet daily. 3. Aspirin 81 mg daily. 4. Glipizide 10 mg 1 tablet daily. 5. Hydrochlorothiazide 50 mg 1 tablet daily. 6. Lipitor 10 mg 1/2 tablet daily. 7. Multivitamin 1 tablet daily. 8. Cinnamon supplement 2 tablets daily once discharged home.  He will also be given prescription for: 1. Augmentin 875 one tablet twice daily. 2. Vicodin 0.2 tablets q.4h. p.r.n. severe pain or at dressing     changes.  DISCHARGE INSTRUCTIONS:  The patient will have home health arranged to do normal saline wet-to-dry packing, change of his perineum gluteal wounds.  He can change the outer dressing on a p.r.n. basis for any soilage or contamination.  He is instructed to follow up with his primary care physician, probably within the next 1 to 2 weeks to have some repeat labs drawn for his renal function and diabetes management. He was also instructed to contact Dr. Michaell Cowing at [facility] for followup in approximately 7 to 10 days.     Brayton El, PA-C   ______________________________ Lennie Muckle, MD    KB/MEDQ  D:  12/26/2010  T:  12/26/2010  Job:  [id number]  Electronically Signed by Brayton El  on 02/05/2011 02:31:45 PM Electronically Signed by Bertram Savin MD on 02/20/2011 11:40:41 AM

## 2011-03-29 ENCOUNTER — Encounter (INDEPENDENT_AMBULATORY_CARE_PROVIDER_SITE_OTHER): Payer: Self-pay | Admitting: Surgery

## 2011-03-29 ENCOUNTER — Ambulatory Visit (INDEPENDENT_AMBULATORY_CARE_PROVIDER_SITE_OTHER): Payer: Medicare Other | Admitting: Surgery

## 2011-03-29 VITALS — BP 140/96 | HR 80 | Temp 96.4°F | Ht 72.0 in | Wt 251.8 lb

## 2011-03-29 DIAGNOSIS — N501 Vascular disorders of male genital organs: Secondary | ICD-10-CM

## 2011-03-29 DIAGNOSIS — N493 Fournier gangrene: Secondary | ICD-10-CM

## 2011-03-29 NOTE — Patient Instructions (Signed)
Continue wound care with gentle washing/bathing & dry gauze on wounds.

## 2011-03-29 NOTE — Progress Notes (Signed)
Subjective:     Patient ID: Jose Roberts, male   DOB: Aug 01, 1946, 65 y.o.   MRN: [id number]    BP 140/96  Pulse 80  Temp(Src) 96.4 F (35.8 C) (Temporal)  Ht 6' (1.829 m)  Wt 251 lb 12.8 oz (114.216 kg)  BMI 34.15 kg/m2    HPI   Diagnosis: Fournier gangrene  Procedures excision debridement on May 4/7/8, 2012  Reason for visit: Followup  Patient comes a feeling better.  Continues dressing changes every day. His soreness is going down. His glucose has been in good control. He's not having problems with constipation or diarrhea.  Review of Systems   per HPI.    Objective:   Physical Exam  Constitutional: He appears well-developed and well-nourished.  Eyes:       Legally blind - unchanged  Neck: Normal range of motion.  Cardiovascular: Normal rate and intact distal pulses.   Pulmonary/Chest: Effort normal. No respiratory distress.  Abdominal: Soft. He exhibits no distension. There is no tenderness.  Genitourinary:       Left post perineal wound nearly closed.  Right post perineal 5x2x1cm.  Great granulation, closing        Assessment:     Fournier's gangrene with numerous debridements, wound slowly healing.    Plan:     Continue daily dressing changes  to heal by secondary intention.   Okay to return to work now.  Pt says he needs to get back to work sooner.  Return to clinic in 3 weeks for wound check.

## 2011-04-03 ENCOUNTER — Encounter (INDEPENDENT_AMBULATORY_CARE_PROVIDER_SITE_OTHER): Payer: Medicare Other | Admitting: Surgery

## 2011-04-19 ENCOUNTER — Encounter (INDEPENDENT_AMBULATORY_CARE_PROVIDER_SITE_OTHER): Payer: Medicare Other | Admitting: Surgery

## 2011-04-19 ENCOUNTER — Encounter (INDEPENDENT_AMBULATORY_CARE_PROVIDER_SITE_OTHER): Payer: Self-pay | Admitting: Surgery

## 2011-04-19 ENCOUNTER — Ambulatory Visit (INDEPENDENT_AMBULATORY_CARE_PROVIDER_SITE_OTHER): Payer: Medicare Other | Admitting: Surgery

## 2011-04-19 VITALS — BP 142/88 | HR 62 | Temp 97.0°F

## 2011-04-19 DIAGNOSIS — N501 Vascular disorders of male genital organs: Secondary | ICD-10-CM

## 2011-04-19 DIAGNOSIS — N493 Fournier gangrene: Secondary | ICD-10-CM

## 2011-04-19 NOTE — Patient Instructions (Signed)
Continue wound care with shower/bath every day & dry gauze.

## 2011-04-19 NOTE — Progress Notes (Signed)
Subjective:     Patient ID: Jose Roberts, male   DOB: Jan 08, 1946, 65 y.o.   MRN: [id number]    BP 142/88  Pulse 62  Temp 97 F (36.1 C)    HPI   Diagnosis: Fournier gangrene  Procedures excision debridement on May 4/7/8, 2012  Reason for visit: Followup  Patient comes today feeling better.  Wondering it it is Okay to bowl.  Continues dressing changes every day. His soreness is going down. His glucose has been in good control. He's not having problems with constipation or diarrhea.  Review of Systems   per HPI.    Objective:   Physical Exam  Constitutional: He appears well-developed and well-nourished.  Eyes:       Legally blind - unchanged  Neck: Normal range of motion.  Cardiovascular: Normal rate and intact distal pulses.   Pulmonary/Chest: Effort normal. No respiratory distress.  Abdominal: Soft. He exhibits no distension. There is no tenderness.  Genitourinary:       Left post perineal wound only 5mm.  Right post perineal 3x1x1cm.  Great granulation, closing        Assessment:     Fournier's gangrene with numerous debridements, wound slowly healing.    Plan:     Continue daily dressing changes  to heal by secondary intention.   Okay to return to regular unrestricted activities.   Return to clinic in 6 weeks for wound check.

## 2011-06-05 ENCOUNTER — Encounter (INDEPENDENT_AMBULATORY_CARE_PROVIDER_SITE_OTHER): Payer: Medicare Other | Admitting: General Surgery

## 2011-06-27 ENCOUNTER — Ambulatory Visit (INDEPENDENT_AMBULATORY_CARE_PROVIDER_SITE_OTHER): Payer: Medicare Other | Admitting: Surgery

## 2011-06-27 ENCOUNTER — Encounter (INDEPENDENT_AMBULATORY_CARE_PROVIDER_SITE_OTHER): Payer: Self-pay | Admitting: Surgery

## 2011-06-27 VITALS — BP 132/86 | HR 68 | Temp 97.5°F | Resp 16 | Ht 71.0 in | Wt 269.2 lb

## 2011-06-27 DIAGNOSIS — N501 Vascular disorders of male genital organs: Secondary | ICD-10-CM

## 2011-06-27 DIAGNOSIS — N493 Fournier gangrene: Secondary | ICD-10-CM

## 2011-06-27 NOTE — Patient Instructions (Signed)
Wound Care Wound care helps prevent pain and infection.  You may need a tetanus shot if:  You cannot remember when you had your last tetanus shot.   You have never had a tetanus shot.   The injury broke your skin.  If you need a tetanus shot and you choose not to have one, you may get tetanus. Sickness from tetanus can be serious. HOME CARE   Only take medicine as told by your doctor.   Clean the wound daily with mild soap and water.   Change any bandages (dressings) as told by your doctor.   Put medicated cream and a bandage on the wound as told by your doctor.   Change the bandage if it gets wet, dirty, or starts to smell.   Take showers/baths.   Rest and raise (elevate) the wound until the pain and puffiness (swelling) are better.   Keep all doctor visits as told.  GET HELP RIGHT AWAY IF:   Yellowish-white fluid (pus) comes from the wound.   Medicine does not lessen your pain.   There is a red streak going away from the wound.   You cannot move your finger or toe.   You have a fever.  MAKE SURE YOU:   Understand these instructions.   Will watch your condition.   Will get help right away if you are not doing well or get worse.  Document Released: 05/08/2008 Document Revised: 04/11/2011 Document Reviewed: 12/03/2010 [facility] Patient Information 2012 [place], [place].

## 2011-06-27 NOTE — Progress Notes (Signed)
Subjective:     Patient ID: Jose Roberts, male   DOB: 1946/01/25, 65 y.o.   MRN: [id number]  HPI  Patient Care Team: Sid Falcon as PCP - General (Family Medicine)  This patient is a 65 y.o.male who presents today for surgical evaluation.   Procedure: Incision and debridement of Fournier's gangrene  Patient comes in today feeling better. He is frustrated that the wound had not totally closed down. His wife is here today and continues daily dressing changes. He notes his hemoglobin A1c increased to 8. Normally around. Both he and his wife noted sugars are running in the 100s. No problems with diarrhea. No fevers or chills or sore  Past Medical History  Diagnosis Date  . Hypertension   . Diabetes mellitus   . Blind   . Gangrene   . Hemorrhoids   . Fistula     Past Surgical History  Procedure Date  . Surgery on gangrene May 2012 x 2    Fournier's Gangrene of perineum  . Retinal detachment surgery   . Hemorrhoid surgery     revision  . Eye surgery     History   Social History  . Marital Status: Married    Spouse Name: N/A    Number of Children: N/A  . Years of Education: N/A   Occupational History  . Not on file.   Social History Main Topics  . Smoking status: Never Smoker   . Smokeless tobacco: Never Used  . Alcohol Use: 0.0 oz/week    8-10 Cans of beer per week  . Drug Use: No  . Sexually Active: Not on file   Other Topics Concern  . Not on file   Social History Narrative  . No narrative on file    Family History  Problem Relation Age of Onset  . Diabetes Mother   . Hypertension Mother   . Cancer Mother     pancreas  . Diabetes Father   . Hypertension Father   . Diabetes Brother   . Diabetes Sister     Current outpatient prescriptions:amLODipine (NORVASC) 10 MG tablet, , Disp: , Rfl: ;  BOOSTRIX 5-2.5-18.5 injection, , Disp: , Rfl: ;  HYDROcodone-acetaminophen (NORCO) 5-325 MG per tablet, as needed., Disp: , Rfl: ;   lisinopril-hydrochlorothiazide (PRINZIDE,ZESTORETIC) 20-25 MG per tablet, , Disp: , Rfl: ;  metFORMIN (GLUCOPHAGE-XR) 500 MG 24 hr tablet, , Disp: , Rfl:  ezetimibe-simvastatin (VYTORIN) 10-10 MG per tablet, Take 1 tablet by mouth at bedtime.  , Disp: , Rfl: ;  GLIPIZIDE XL 10 MG 24 hr tablet, Daily., Disp: , Rfl: ;  hydrochlorothiazide 50 MG tablet, daily. , Disp: , Rfl: ;  LEVEMIR FLEXPEN 100 UNIT/ML injection, daily. , Disp: , Rfl: ;  lisinopril (PRINIVIL,ZESTRIL) 40 MG tablet, daily. , Disp: , Rfl: ;  Multiple Vitamins-Minerals (CENTURY SENIOR FORMULA PO), Take by mouth daily.  , Disp: , Rfl:  ZOSTAVAX 40981 UNT/0.65ML injection, , Disp: , Rfl:   No Known Allergies     Review of Systems  Constitutional: Negative for fever, chills and diaphoresis.  HENT: Negative for sore throat, trouble swallowing and neck pain.   Eyes: Positive for visual disturbance. Negative for photophobia.       Legally blind  Respiratory: Negative for choking and shortness of breath.   Cardiovascular: Negative for chest pain and palpitations.  Gastrointestinal: Negative for nausea, vomiting, abdominal distention, anal bleeding and rectal pain.  Genitourinary: Negative for dysuria, urgency, difficulty urinating and testicular pain.  Musculoskeletal: Negative for myalgias, arthralgias and gait problem.  Skin: Negative for color change and rash.  Neurological: Negative for dizziness, speech difficulty, weakness and numbness.  Hematological: Negative for adenopathy.  Psychiatric/Behavioral: Negative for hallucinations, confusion and agitation.       Objective:   Physical Exam  Constitutional: He is oriented to person, place, and time. He appears well-developed and well-nourished. No distress.  HENT:  Head: Normocephalic.  Mouth/Throat: Oropharynx is clear and moist. No oropharyngeal exudate.  Eyes: EOM are normal. No scleral icterus.  Neck: Normal range of motion. No tracheal deviation present.    Cardiovascular: Normal rate, normal heart sounds and intact distal pulses.   Pulmonary/Chest: Effort normal. No respiratory distress.  Abdominal: Soft. He exhibits no distension. There is no tenderness. Hernia confirmed negative in the right inguinal area and confirmed negative in the left inguinal area.       Incisions clean with normal healing ridges.  No hernias  Genitourinary:       Left gluteal wounds closed.  R perirectal wound 3x2x1 cm mostly superficial.  Cauterized some eruptive granulation tisse  Musculoskeletal: Normal range of motion. He exhibits no tenderness.  Neurological: He is alert and oriented to person, place, and time. No cranial nerve deficit. He exhibits normal muscle tone. Coordination normal.  Skin: Skin is warm and dry. No rash noted. He is not diaphoretic.  Psychiatric: He has a normal mood and affect. His behavior is normal.       Assessment:     S/p I&D Fournier's Gangrene.  Wounds nearly closed    Plan:     Continue daily dressing changes.  Pack into base of wound  Increase activity as tolerated.  Do not push through pain.  Advanced on diet as tolerated. Bowel regimen to avoid problems.  Return to clinic one month. Wound should be closed by then.  The patient & wifeexpressed understanding and appreciation

## 2011-07-30 ENCOUNTER — Ambulatory Visit (INDEPENDENT_AMBULATORY_CARE_PROVIDER_SITE_OTHER): Payer: Medicare Other | Admitting: Surgery

## 2011-07-30 ENCOUNTER — Encounter (INDEPENDENT_AMBULATORY_CARE_PROVIDER_SITE_OTHER): Payer: Self-pay | Admitting: Surgery

## 2011-07-30 VITALS — BP 142/98 | HR 60 | Temp 98.8°F | Resp 12 | Ht 71.0 in | Wt 266.2 lb

## 2011-07-30 DIAGNOSIS — N493 Fournier gangrene: Secondary | ICD-10-CM

## 2011-07-30 DIAGNOSIS — N501 Vascular disorders of male genital organs: Secondary | ICD-10-CM

## 2011-07-30 NOTE — Progress Notes (Signed)
Subjective:     Patient ID: Jose Roberts, male   DOB: [birth year], 65 y.o.   MRN: [id number]  HPI   Patient Care Team: Jose Roberts as PCP - General (Family Medicine)  This patient is a 65 y.o.male who presents today for surgical evaluation.   Procedure: Incision and debridement of Fournier's gangrene May 2012 x 3  Patient comes in today feeling better. The wound had not totally closed down, but is smaller. His wife is here today and continues daily dressing changes. Down to 2x2 gauze. Both he and his wife noted sugars are running in the 100s. No problems with diarrhea. No fevers or chills or sore  Past Medical History  Diagnosis Date  . Hypertension   . Diabetes mellitus   . Blind   . Gangrene   . Hemorrhoids   . Fistula     Past Surgical History  Procedure Date  . Surgery on gangrene May 6/7/8, 2012 (x 3 debridements)    Fournier's Gangrene of perineum  . Retinal detachment surgery   . Hemorrhoid surgery     revision  . Eye surgery     History   Social History  . Marital Status: Married    Spouse Name: N/A    Number of Children: N/A  . Years of Education: N/A   Occupational History  . Not on file.   Social History Main Topics  . Smoking status: Never Smoker   . Smokeless tobacco: Never Used  . Alcohol Use: 0.0 oz/week    8-10 Cans of beer per week  . Drug Use: No  . Sexually Active: Not on file   Other Topics Concern  . Not on file   Social History Narrative  . No narrative on file    Family History  Problem Relation Age of Onset  . Diabetes Mother   . Hypertension Mother   . Cancer Mother     pancreas  . Diabetes Father   . Hypertension Father   . Diabetes Brother   . Diabetes Sister     Current outpatient prescriptions:amLODipine (NORVASC) 10 MG tablet, , Disp: , Rfl: ;  BOOSTRIX 5-2.5-18.5 injection, , Disp: , Rfl: ;  ezetimibe-simvastatin (VYTORIN) 10-10 MG per tablet, Take 1 tablet by mouth at bedtime.  , Disp: , Rfl: ;  GLIPIZIDE  XL 10 MG 24 hr tablet, Daily., Disp: , Rfl: ;  hydrochlorothiazide 50 MG tablet, daily. , Disp: , Rfl: ;  HYDROcodone-acetaminophen (NORCO) 5-325 MG per tablet, as needed., Disp: , Rfl:  LEVEMIR FLEXPEN 100 UNIT/ML injection, daily. , Disp: , Rfl: ;  lisinopril (PRINIVIL,ZESTRIL) 40 MG tablet, daily. , Disp: , Rfl: ;  lisinopril-hydrochlorothiazide (PRINZIDE,ZESTORETIC) 20-25 MG per tablet, , Disp: , Rfl: ;  metFORMIN (GLUCOPHAGE-XR) 500 MG 24 hr tablet, , Disp: , Rfl: ;  Multiple Vitamins-Minerals (CENTURY SENIOR FORMULA PO), Take by mouth daily.  , Disp: , Rfl: ;  ZOSTAVAX 82956 UNT/0.65ML injection, , Disp: , Rfl:   No Known Allergies     Review of Systems  Constitutional: Negative for fever, chills and diaphoresis.  HENT: Negative for sore throat, trouble swallowing and neck pain.   Eyes: Positive for visual disturbance. Negative for photophobia.       Legally blind  Respiratory: Negative for choking and shortness of breath.   Cardiovascular: Negative for chest pain and palpitations.  Gastrointestinal: Negative for nausea, vomiting, abdominal distention, anal bleeding and rectal pain.  Genitourinary: Negative for dysuria, urgency, difficulty urinating and testicular pain.  Musculoskeletal: Negative for myalgias, arthralgias and gait problem.  Skin: Negative for color change and rash.  Neurological: Negative for dizziness, speech difficulty, weakness and numbness.  Hematological: Negative for adenopathy.  Psychiatric/Behavioral: Negative for hallucinations, confusion and agitation.       Objective:   Physical Exam  Constitutional: He is oriented to person, place, and time. He appears well-developed and well-nourished. No distress.  HENT:  Head: Normocephalic.  Mouth/Throat: Oropharynx is clear and moist. No oropharyngeal exudate.  Eyes: EOM are normal. No scleral icterus.  Neck: Normal range of motion. No tracheal deviation present.  Cardiovascular: Normal rate, normal heart  sounds and intact distal pulses.   Pulmonary/Chest: Effort normal. No respiratory distress.  Abdominal: Soft. He exhibits no distension. There is no tenderness. Hernia confirmed negative in the right inguinal area and confirmed negative in the left inguinal area.       Incisions clean with normal healing ridges.  No hernias  Genitourinary:       Left gluteal wounds closed.  R perirectal wound 2x1x1 cm mostly superficial.  Again, I cauterized some eruptive granulation tissue  Musculoskeletal: Normal range of motion. He exhibits no tenderness.  Neurological: He is alert and oriented to person, place, and time. No cranial nerve deficit. He exhibits normal muscle tone. Coordination normal.  Skin: Skin is warm and dry. No rash noted. He is not diaphoretic.  Psychiatric: He has a normal mood and affect. His behavior is normal.       Assessment:     S/p I&D Fournier's Gangrene.  Wounds nearly closed    Plan:     Continue daily dressing changes.  Pack into base of wound.  USe cotton ball to help dry out.  Stop creams  Increase activity as tolerated.  Do not push through pain.  Advanced on diet as tolerated. Bowel regimen to avoid problems.  Return to clinic one month. Wound should be closed by then.  The patient & wifeexpressed understanding and appreciation

## 2011-09-17 ENCOUNTER — Encounter (INDEPENDENT_AMBULATORY_CARE_PROVIDER_SITE_OTHER): Payer: Self-pay | Admitting: Surgery

## 2011-09-17 ENCOUNTER — Ambulatory Visit (INDEPENDENT_AMBULATORY_CARE_PROVIDER_SITE_OTHER): Payer: Medicare Other | Admitting: Surgery

## 2011-09-17 VITALS — BP 130/82 | HR 68 | Temp 97.8°F | Resp 18 | Ht 71.0 in | Wt 263.2 lb

## 2011-09-17 DIAGNOSIS — N501 Vascular disorders of male genital organs: Secondary | ICD-10-CM

## 2011-09-17 DIAGNOSIS — N493 Fournier gangrene: Secondary | ICD-10-CM

## 2011-09-17 NOTE — Progress Notes (Signed)
Subjective:     Patient ID: Jose Roberts, male   DOB: [birth year], 66 y.o.   MRN: [id number]  Wound Check    Patient Care Team: Sid Falcon as PCP - General (Family Medicine)  This patient is a 66 y.o.male who presents today for surgical evaluation.   Procedure: Incision and debridement of Fournier's gangrene May 2012 x 3  Patient comes in today feeling well. The wound is nearly closed down.  Mild tingling in the area for a few seconds "at times."  Down to 2x2 gauze.  Glucose control "good.". No problems with diarrhea.  Mild constipation despite Metamucil.  No fevers or chills or sore  Past Medical History  Diagnosis Date  . Hypertension   . Diabetes mellitus   . Blind   . Gangrene   . Hemorrhoids   . Fistula     Past Surgical History  Procedure Date  . Surgery on gangrene May 6/7/8, 2012 (x 3 debridements)    Fournier's Gangrene of perineum  . Retinal detachment surgery   . Hemorrhoid surgery     revision  . Eye surgery     History   Social History  . Marital Status: Married    Spouse Name: N/A    Number of Children: N/A  . Years of Education: N/A   Occupational History  . Not on file.   Social History Main Topics  . Smoking status: Never Smoker   . Smokeless tobacco: Never Used  . Alcohol Use: 0.0 oz/week    8-10 Cans of beer per week  . Drug Use: No  . Sexually Active: Not on file   Other Topics Concern  . Not on file   Social History Narrative  . No narrative on file    Family History  Problem Relation Age of Onset  . Diabetes Mother   . Hypertension Mother   . Cancer Mother     pancreas  . Diabetes Father   . Hypertension Father   . Diabetes Brother   . Diabetes Sister     Current outpatient prescriptions:amLODipine (NORVASC) 10 MG tablet, 10 mg daily. , Disp: , Rfl: ;  ezetimibe-simvastatin (VYTORIN) 10-10 MG per tablet, Take 1 tablet by mouth at bedtime.  , Disp: , Rfl: ;  GLIPIZIDE XL 10 MG 24 hr tablet, Daily., Disp: , Rfl: ;   hydrochlorothiazide 50 MG tablet, daily. , Disp: , Rfl: ;  LEVEMIR FLEXPEN 100 UNIT/ML injection, daily. , Disp: , Rfl:  lisinopril-hydrochlorothiazide (PRINZIDE,ZESTORETIC) 20-25 MG per tablet, , Disp: , Rfl: ;  metFORMIN (GLUCOPHAGE-XR) 500 MG 24 hr tablet, 1,000 mg 2 (two) times daily. , Disp: , Rfl: ;  Multiple Vitamins-Minerals (CENTURY SENIOR FORMULA PO), Take by mouth daily.  , Disp: , Rfl:   No Known Allergies     Review of Systems  Constitutional: Negative for fever, chills and diaphoresis.  HENT: Negative for sore throat, trouble swallowing and neck pain.   Eyes: Positive for visual disturbance. Negative for photophobia.       Legally blind  Respiratory: Negative for choking and shortness of breath.   Cardiovascular: Negative for chest pain and palpitations.  Gastrointestinal: Negative for nausea, vomiting, abdominal distention, anal bleeding and rectal pain.  Genitourinary: Negative for dysuria, urgency, difficulty urinating and testicular pain.  Musculoskeletal: Negative for myalgias, arthralgias and gait problem.  Skin: Negative for color change and rash.  Neurological: Negative for dizziness, speech difficulty, weakness and numbness.  Hematological: Negative for adenopathy.  Psychiatric/Behavioral: Negative for  hallucinations, confusion and agitation.       Objective:   Physical Exam  Constitutional: He is oriented to person, place, and time. He appears well-developed and well-nourished. No distress.  HENT:  Head: Normocephalic.  Mouth/Throat: Oropharynx is clear and moist. No oropharyngeal exudate.  Eyes: EOM are normal. No scleral icterus.  Neck: Normal range of motion. No tracheal deviation present.  Cardiovascular: Normal rate, normal heart sounds and intact distal pulses.   Pulmonary/Chest: Effort normal. No respiratory distress.  Abdominal: Soft. He exhibits no distension. There is no tenderness. Hernia confirmed negative in the right inguinal area and confirmed  negative in the left inguinal area.       Incisions clean with normal healing ridges.  No hernias  Genitourinary:       Left gluteal wounds closed.    R perirectal wound 3-31mm superficial openings x 2.    Musculoskeletal: Normal range of motion. He exhibits no tenderness.  Neurological: He is alert and oriented to person, place, and time. No cranial nerve deficit. He exhibits normal muscle tone. Coordination normal.  Skin: Skin is warm and dry. No rash noted. He is not diaphoretic.  Psychiatric: He has a normal mood and affect. His behavior is normal.       Assessment:     S/p I&D Fournier's Gangrene.  Wounds almost closed    Plan:     Continue daily dressing changes.  Pack into base of wound.  Use cotton ball to help dry out.    Advanced on diet as tolerated.  Increase bowel regimen to avoid worsening constipation.  Return to clinic ~6weeks for final check. Wound should be closed by then.  The patient expressed understanding and appreciation

## 2011-11-07 ENCOUNTER — Encounter (INDEPENDENT_AMBULATORY_CARE_PROVIDER_SITE_OTHER): Payer: Self-pay | Admitting: Surgery

## 2011-11-07 ENCOUNTER — Ambulatory Visit (INDEPENDENT_AMBULATORY_CARE_PROVIDER_SITE_OTHER): Payer: Medicare Other | Admitting: Surgery

## 2011-11-07 VITALS — BP 122/68 | HR 64 | Temp 97.8°F | Resp 18 | Ht 71.0 in | Wt 260.6 lb

## 2011-11-07 DIAGNOSIS — N493 Fournier gangrene: Secondary | ICD-10-CM

## 2011-11-07 DIAGNOSIS — N501 Vascular disorders of male genital organs: Secondary | ICD-10-CM

## 2011-11-07 NOTE — Progress Notes (Signed)
Subjective:     Patient ID: Jose Roberts, male   DOB: [birth year], 66 y.o.   MRN: [id number]  Wound Check    Patient Care Team: Sid Falcon as PCP - General (Family Medicine)  This patient is a 66 y.o.male who presents today for surgical evaluation.   Procedure: Incision and debridement of Fournier's gangrene May 2012 x 3  Patient comes in today feeling well. The wound is nearly closed down.  He doesn't like the feel of the fold from the healed deep wound - but it does not hurt/itch/drain.  Wife with him today.  Glucose control "good.". No problems with diarrhea/constipation on Metamucil.  No fevers or chills or sore  Past Medical History  Diagnosis Date  . Hypertension   . Diabetes mellitus   . Blind   . Gangrene   . Hemorrhoids   . Fistula     Past Surgical History  Procedure Date  . Surgery on gangrene May 6/7/8, 2012 (x 3 debridements)    Fournier's Gangrene of perineum  . Retinal detachment surgery   . Hemorrhoid surgery     revision  . Eye surgery     History   Social History  . Marital Status: Married    Spouse Name: N/A    Number of Children: N/A  . Years of Education: N/A   Occupational History  . Not on file.   Social History Main Topics  . Smoking status: Never Smoker   . Smokeless tobacco: Never Used  . Alcohol Use: 0.0 oz/week    8-10 Cans of beer per week  . Drug Use: No  . Sexually Active: Not on file   Other Topics Concern  . Not on file   Social History Narrative  . No narrative on file    Family History  Problem Relation Age of Onset  . Diabetes Mother   . Hypertension Mother   . Cancer Mother     pancreas  . Diabetes Father   . Hypertension Father   . Diabetes Brother   . Diabetes Sister     Current outpatient prescriptions:amLODipine (NORVASC) 10 MG tablet, 10 mg daily. , Disp: , Rfl: ;  ezetimibe-simvastatin (VYTORIN) 10-10 MG per tablet, Take 1 tablet by mouth at bedtime.  , Disp: , Rfl: ;  GLIPIZIDE XL 10 MG 24 hr  tablet, Daily., Disp: , Rfl: ;  hydrochlorothiazide 50 MG tablet, daily. , Disp: , Rfl: ;  LEVEMIR FLEXPEN 100 UNIT/ML injection, daily. , Disp: , Rfl:  lisinopril-hydrochlorothiazide (PRINZIDE,ZESTORETIC) 20-25 MG per tablet, , Disp: , Rfl: ;  metFORMIN (GLUCOPHAGE-XR) 500 MG 24 hr tablet, 1,000 mg 2 (two) times daily. , Disp: , Rfl: ;  Multiple Vitamins-Minerals (CENTURY SENIOR FORMULA PO), Take by mouth daily.  , Disp: , Rfl:   No Known Allergies     Review of Systems  Constitutional: Negative for fever, chills and diaphoresis.  HENT: Negative for sore throat, trouble swallowing and neck pain.   Eyes: Positive for visual disturbance. Negative for photophobia.       Legally blind  Respiratory: Negative for choking and shortness of breath.   Cardiovascular: Negative for chest pain and palpitations.  Gastrointestinal: Negative for nausea, vomiting, abdominal distention, anal bleeding and rectal pain.  Genitourinary: Negative for dysuria, urgency, difficulty urinating and testicular pain.  Musculoskeletal: Negative for myalgias, arthralgias and gait problem.  Skin: Negative for color change and rash.  Neurological: Negative for dizziness, speech difficulty, weakness and numbness.  Hematological: Negative for  adenopathy.  Psychiatric/Behavioral: Negative for hallucinations, confusion and agitation.       Objective:   Physical Exam  Constitutional: He is oriented to person, place, and time. He appears well-developed and well-nourished. No distress.  HENT:  Head: Normocephalic.  Mouth/Throat: Oropharynx is clear and moist. No oropharyngeal exudate.  Eyes: EOM are normal. No scleral icterus.  Neck: Normal range of motion. No tracheal deviation present.  Cardiovascular: Normal rate, normal heart sounds and intact distal pulses.   Pulmonary/Chest: Effort normal. No respiratory distress.  Abdominal: Soft. He exhibits no distension. There is no tenderness. Hernia confirmed negative in the  right inguinal area and confirmed negative in the left inguinal area.       Incisions clean with normal healing ridges.  No hernias  Genitourinary:       Left gluteal wounds closed.    R perirectal wound 3mm superficial opening - nearly closed down.    Musculoskeletal: Normal range of motion. He exhibits no tenderness.  Neurological: He is alert and oriented to person, place, and time. No cranial nerve deficit. He exhibits normal muscle tone. Coordination normal.  Skin: Skin is warm and dry. No rash noted. He is not diaphoretic.  Psychiatric: He has a normal mood and affect. His behavior is normal.       Assessment:     S/p I&D Fournier's Gangrene.  Wounds almost closed    Plan:     Continue keeping perineum clean/dry.  Cotton ball/gauze PRN.    Advanced on diet as tolerated.  Increase bowel regimen to avoid worsening constipation.  OK to be active.  Continue strict DM control   Return to clinic ~6weeks for final check. Wound should be closed by then.  The patient expressed understanding and appreciation

## 2011-12-19 ENCOUNTER — Encounter (INDEPENDENT_AMBULATORY_CARE_PROVIDER_SITE_OTHER): Payer: Medicare Other | Admitting: Surgery

## 2012-01-08 ENCOUNTER — Encounter (INDEPENDENT_AMBULATORY_CARE_PROVIDER_SITE_OTHER): Payer: Medicare Other | Admitting: Surgery

## 2012-01-15 ENCOUNTER — Ambulatory Visit (INDEPENDENT_AMBULATORY_CARE_PROVIDER_SITE_OTHER): Payer: Medicare Other | Admitting: Surgery

## 2012-01-15 ENCOUNTER — Encounter (INDEPENDENT_AMBULATORY_CARE_PROVIDER_SITE_OTHER): Payer: Self-pay | Admitting: Surgery

## 2012-01-15 ENCOUNTER — Encounter (INDEPENDENT_AMBULATORY_CARE_PROVIDER_SITE_OTHER): Payer: Medicare Other | Admitting: Surgery

## 2012-01-15 VITALS — BP 122/82 | HR 76 | Temp 98.0°F | Resp 16 | Ht 71.0 in | Wt 259.2 lb

## 2012-01-15 DIAGNOSIS — N493 Fournier gangrene: Secondary | ICD-10-CM

## 2012-01-15 DIAGNOSIS — N501 Vascular disorders of male genital organs: Secondary | ICD-10-CM

## 2012-01-15 NOTE — Progress Notes (Signed)
Subjective:     Patient ID: Jose Roberts, male   DOB: 1946/01/08, 66 y.o.   MRN: [id number]  Wound Check    Patient Care Team: Loyal Jacobson as PCP - General (Family Medicine)  This patient is a 66 y.o.male who presents today for surgical evaluation.   Procedure: Incision and debridement of Fournier's gangrene May 2012 x 3  Patient comes in today feeling well.  He thinks it is closed down.  No obvious drainage.  Glucose control "good.". No problems with diarrhea/constipation on Metamucil.  No fevers or chills  Past Medical History  Diagnosis Date  . Hypertension   . Diabetes mellitus   . Blind   . Gangrene   . Hemorrhoids   . Fistula     Past Surgical History  Procedure Date  . Surgery on gangrene May 6/7/8, 2012 (x 3 debridements)    Fournier's Gangrene of perineum  . Retinal detachment surgery   . Hemorrhoid surgery     revision  . Eye surgery     History   Social History  . Marital Status: Married    Spouse Name: N/A    Number of Children: N/A  . Years of Education: N/A   Occupational History  . Not on file.   Social History Main Topics  . Smoking status: Never Smoker   . Smokeless tobacco: Never Used  . Alcohol Use: 0.0 oz/week    8-10 Cans of beer per week  . Drug Use: No  . Sexually Active: Not on file   Other Topics Concern  . Not on file   Social History Narrative  . No narrative on file    Family History  Problem Relation Age of Onset  . Diabetes Mother   . Hypertension Mother   . Cancer Mother     pancreas  . Diabetes Father   . Hypertension Father   . Diabetes Brother   . Diabetes Sister     Current outpatient prescriptions:amLODipine (NORVASC) 10 MG tablet, 10 mg daily. , Disp: , Rfl: ;  ezetimibe-simvastatin (VYTORIN) 10-10 MG per tablet, Take 1 tablet by mouth at bedtime.  , Disp: , Rfl: ;  GLIPIZIDE XL 10 MG 24 hr tablet, Daily., Disp: , Rfl: ;  lisinopril-hydrochlorothiazide (PRINZIDE,ZESTORETIC) 20-25 MG per tablet, , Disp:  , Rfl: ;  metFORMIN (GLUCOPHAGE-XR) 500 MG 24 hr tablet, 1,000 mg 2 (two) times daily. , Disp: , Rfl:  Multiple Vitamins-Minerals (CENTURY SENIOR FORMULA PO), Take by mouth daily.  , Disp: , Rfl: ;  VICTOZA 18 MG/3ML SOLN, , Disp: , Rfl:   No Known Allergies     Review of Systems  Constitutional: Negative for fever, chills and diaphoresis.  HENT: Negative for sore throat, trouble swallowing and neck pain.   Eyes: Positive for visual disturbance. Negative for photophobia.       Legally blind  Respiratory: Negative for choking and shortness of breath.   Cardiovascular: Negative for chest pain and palpitations.  Gastrointestinal: Negative for nausea, vomiting, abdominal distention, anal bleeding and rectal pain.  Genitourinary: Negative for dysuria, urgency, difficulty urinating and testicular pain.  Musculoskeletal: Negative for myalgias, arthralgias and gait problem.  Skin: Negative for color change and rash.  Neurological: Negative for dizziness, speech difficulty, weakness and numbness.  Hematological: Negative for adenopathy.  Psychiatric/Behavioral: Negative for hallucinations, confusion and agitation.       Objective:   Physical Exam  Constitutional: He is oriented to person, place, and time. He appears well-developed and well-nourished. No  distress.  HENT:  Head: Normocephalic.  Mouth/Throat: Oropharynx is clear and moist. No oropharyngeal exudate.  Eyes: EOM are normal. No scleral icterus.  Neck: Normal range of motion. No tracheal deviation present.  Cardiovascular: Normal rate, normal heart sounds and intact distal pulses.   Pulmonary/Chest: Effort normal. No respiratory distress.  Abdominal: Soft. He exhibits no distension. There is no tenderness. Hernia confirmed negative in the right inguinal area and confirmed negative in the left inguinal area.       Incisions clean with normal healing ridges.  No hernias  Genitourinary:       Left gluteal wounds closed.    R  perirectal wound <49mm granulation dots x2 - I cauterized    Musculoskeletal: Normal range of motion. He exhibits no tenderness.  Neurological: He is alert and oriented to person, place, and time. No cranial nerve deficit. He exhibits normal muscle tone. Coordination normal.  Skin: Skin is warm and dry. No rash noted. He is not diaphoretic.  Psychiatric: He has a normal mood and affect. His behavior is normal.       Assessment:     S/p I&D Fournier's Gangrene.  Wounds closed    Plan:     Continue keeping perineum clean/dry.  Cotton ball/gauze PRN.    Advanced on diet as tolerated.  Increase bowel regimen to avoid worsening constipation.  OK to be active.  Continue strict DM control   Return to clinic PRN.  The patient expressed understanding and appreciation

## 2012-06-16 IMAGING — CR DG ABDOMEN ACUTE W/ 1V CHEST
4 series · 4 of 4 positions shown · non-contrast
Comparison: None

CLINICAL DATA: Abdominal pain.  Rectal bleeding, vomiting.

ACUTE ABDOMEN SERIES (ABDOMEN 2 VIEW & CHEST 1 VIEW)

[w chest pa]
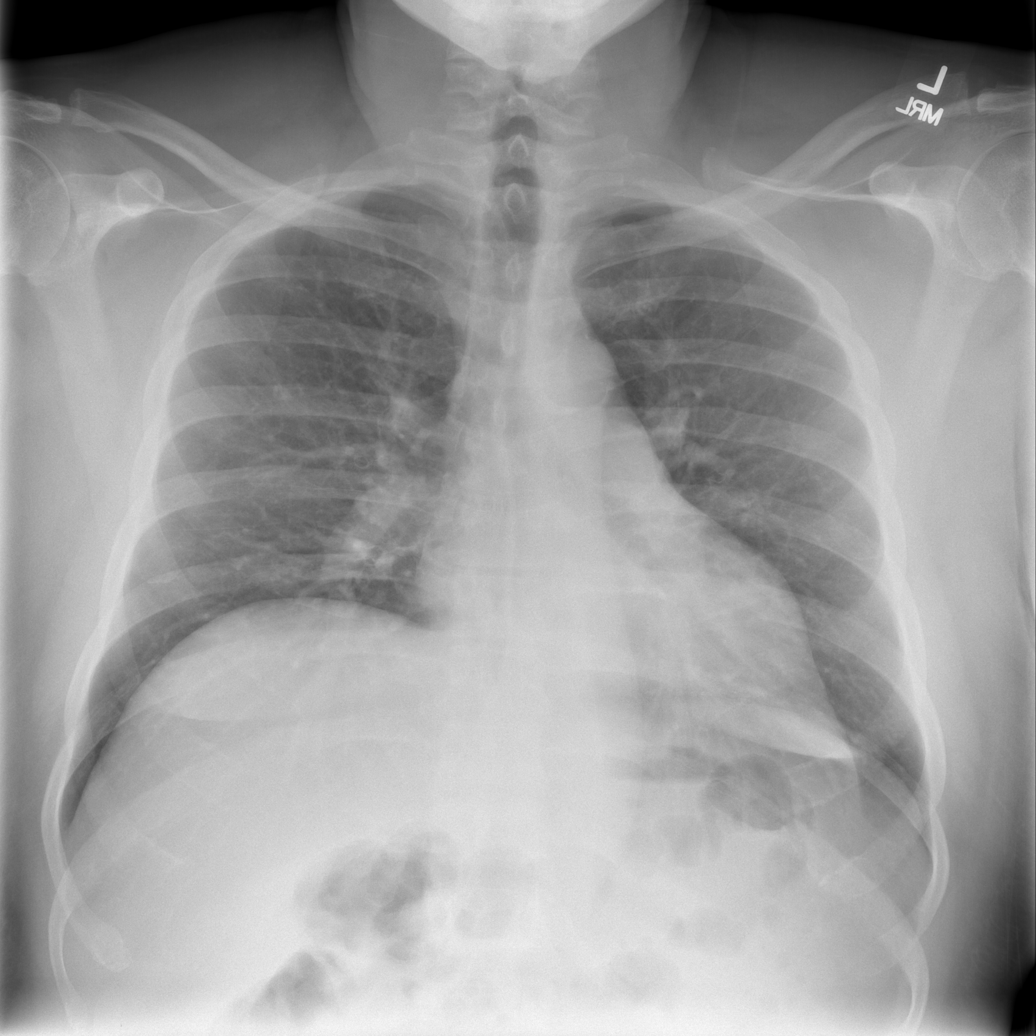

[w abdomen upright *]
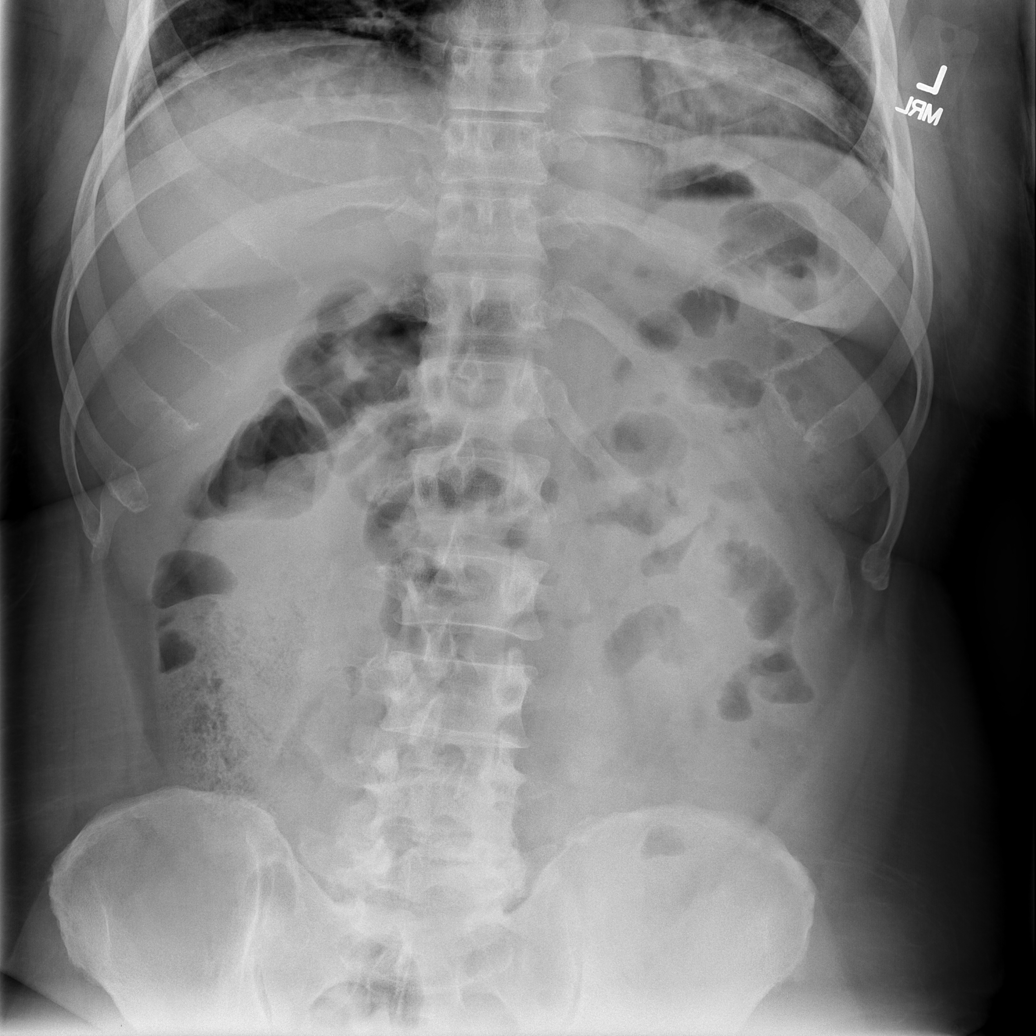

[t abdomen supine (1 of 2)]
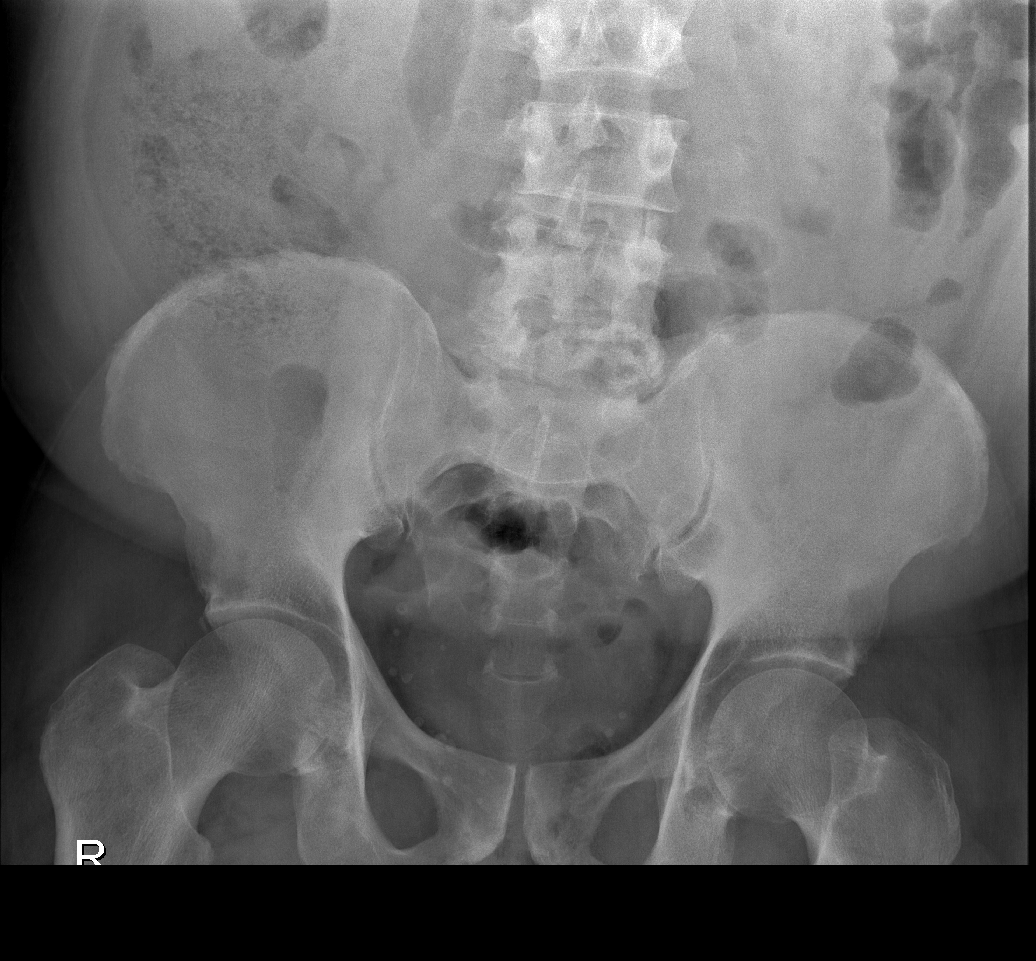

[t abdomen supine (2 of 2)]
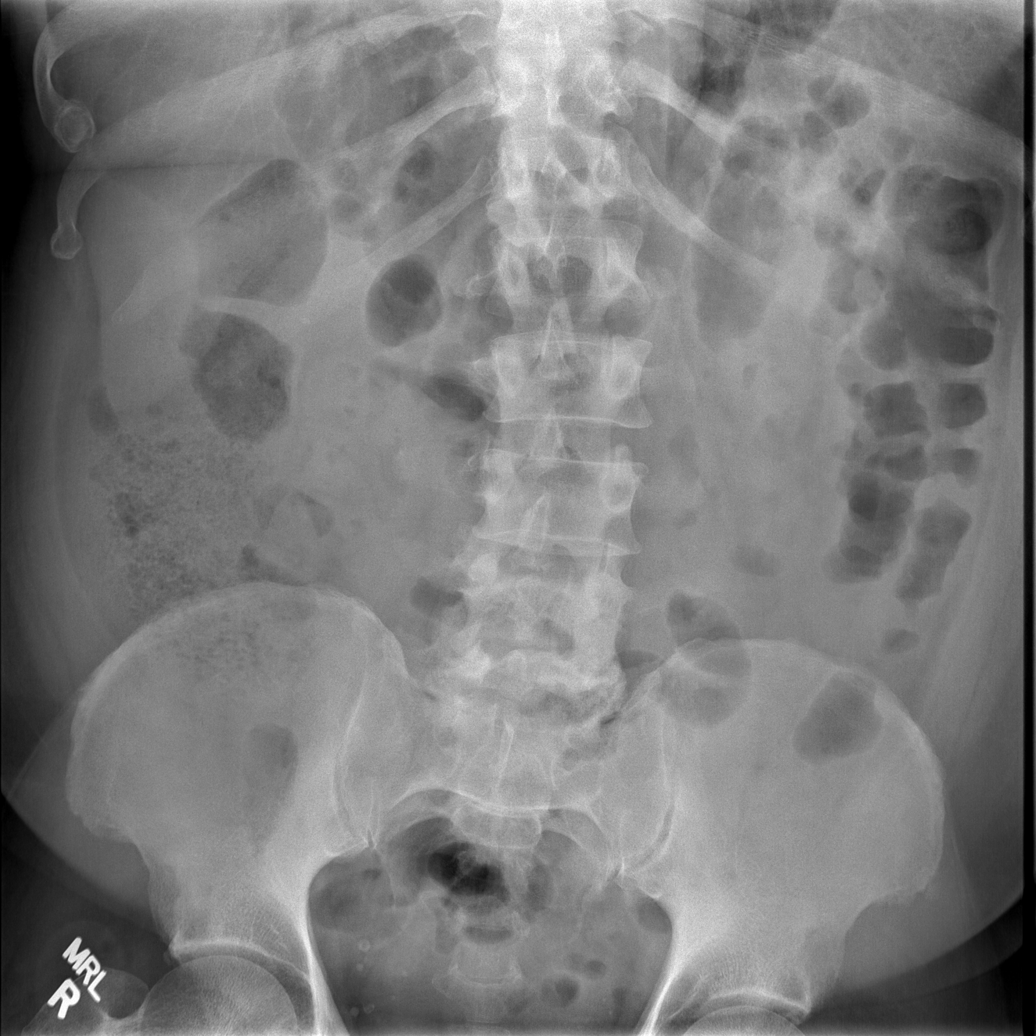

[4 of 4 positions shown; findings below may reference images not displayed]

FINDINGS: There are areas of irregular gas noted within the soft
tissues inferior to the pubic symphysis, possibly in the perineum
or perirectal tissues.  Cannot exclude perirectal abscess.  There
is a nonobstructive bowel gas pattern.  Moderate stool in the right
side of the colon.  No free air, organomegaly or suspicious
calcification.

Heart is borderline in size.  Lungs are clear.  No effusions.
IMPRESSION: Irregular gas collections in the soft tissues of the perirectal
region or perineum.  Cannot exclude perirectal abscess.  Recommend
clinical correlation.  If further evaluation is felt warranted, CT
with IV contrast (oral if possible) may be beneficial.

## 2012-08-30 ENCOUNTER — Emergency Department (HOSPITAL_COMMUNITY)
Admission: EM | Admit: 2012-08-30 | Discharge: 2012-08-30 | Disposition: A | Discharge status: Home / Self Care | Payer: Medicare Other | Attending: Emergency Medicine | Admitting: Emergency Medicine

## 2012-08-30 ENCOUNTER — Encounter (HOSPITAL_COMMUNITY): Payer: Self-pay | Admitting: Emergency Medicine

## 2012-08-30 DIAGNOSIS — L0231 Cutaneous abscess of buttock: Secondary | ICD-10-CM | POA: Insufficient documentation

## 2012-08-30 DIAGNOSIS — H547 Unspecified visual loss: Secondary | ICD-10-CM | POA: Insufficient documentation

## 2012-08-30 DIAGNOSIS — Z794 Long term (current) use of insulin: Secondary | ICD-10-CM | POA: Insufficient documentation

## 2012-08-30 DIAGNOSIS — E119 Type 2 diabetes mellitus without complications: Secondary | ICD-10-CM | POA: Insufficient documentation

## 2012-08-30 DIAGNOSIS — Z8679 Personal history of other diseases of the circulatory system: Secondary | ICD-10-CM | POA: Insufficient documentation

## 2012-08-30 DIAGNOSIS — Z87448 Personal history of other diseases of urinary system: Secondary | ICD-10-CM | POA: Insufficient documentation

## 2012-08-30 DIAGNOSIS — Z9889 Other specified postprocedural states: Secondary | ICD-10-CM | POA: Insufficient documentation

## 2012-08-30 DIAGNOSIS — Z872 Personal history of diseases of the skin and subcutaneous tissue: Secondary | ICD-10-CM | POA: Insufficient documentation

## 2012-08-30 DIAGNOSIS — Z79899 Other long term (current) drug therapy: Secondary | ICD-10-CM | POA: Insufficient documentation

## 2012-08-30 DIAGNOSIS — Z7982 Long term (current) use of aspirin: Secondary | ICD-10-CM | POA: Insufficient documentation

## 2012-08-30 DIAGNOSIS — L0291 Cutaneous abscess, unspecified: Secondary | ICD-10-CM

## 2012-08-30 DIAGNOSIS — Y838 Other surgical procedures as the cause of abnormal reaction of the patient, or of later complication, without mention of misadventure at the time of the procedure: Secondary | ICD-10-CM | POA: Insufficient documentation

## 2012-08-30 DIAGNOSIS — I10 Essential (primary) hypertension: Secondary | ICD-10-CM | POA: Insufficient documentation

## 2012-08-30 DIAGNOSIS — T8140XA Infection following a procedure, unspecified, initial encounter: Secondary | ICD-10-CM | POA: Insufficient documentation

## 2012-08-30 LAB — BASIC METABOLIC PANEL
CO2: 28 mEq/L (ref 19–32)
Chloride: 101 mEq/L (ref 96–112)
GFR calc non Af Amer: 62 mL/min — ABNORMAL LOW (ref >90)
Glucose, Bld: 185 mg/dL — ABNORMAL HIGH (ref 70–99)
Potassium: 4.1 mEq/L (ref 3.5–5.1)
Sodium: 137 mEq/L (ref 135–145)

## 2012-08-30 LAB — URINALYSIS, ROUTINE W REFLEX MICROSCOPIC
Glucose, UA: NEGATIVE mg/dL
Hgb urine dipstick: NEGATIVE
Protein, ur: NEGATIVE mg/dL

## 2012-08-30 LAB — CBC WITH DIFFERENTIAL/PLATELET
Eosinophils Absolute: 0.1 10*3/uL (ref 0.0–0.7)
Hemoglobin: 13.5 g/dL (ref 13.0–17.0)
Lymphs Abs: 1.8 10*3/uL (ref 0.7–4.0)
MCH: 26.8 pg (ref 26.0–34.0)
MCV: 82.7 fL (ref 78.0–100.0)
Monocytes Relative: 7 % (ref 3–12)
Neutrophils Relative %: 71 % (ref 43–77)
RBC: 5.03 MIL/uL (ref 4.22–5.81)

## 2012-08-30 MED ORDER — AMOXICILLIN-POT CLAVULANATE 875-125 MG PO TABS: 1.0000 | ORAL_TABLET | Freq: Two times a day (BID) | ORAL | Status: DC

## 2012-08-30 MED ORDER — SODIUM CHLORIDE 0.9 % IV SOLN
INTRAVENOUS | Status: DC
  Administered 2012-08-30: 12:00:00 via INTRAVENOUS

## 2012-08-30 NOTE — ED Provider Notes (Signed)
History     CSN: [id number]  Arrival date & time 08/30/12  1038   First MD Initiated Contact with Patient 08/30/12 1119      Chief Complaint  Patient presents with  . Post-op Problem    (Consider location/radiation/quality/duration/timing/severity/associated sxs/prior treatment) HPI Comments: Jose Roberts is a 67 y.o. Male was here for evaluation of a swollen, and draining area of the right buttock. Symptoms started several days ago, and are progressive. There are no associated symptoms such as fever, chills, nausea, vomiting, weakness, or dizziness. The area of concern is where he was previously treated for Fournier's gangrene.  He has had no interval problem since his procedure in 2012. There are no known modifying factors.  The history is provided by the patient.    Past Medical History  Diagnosis Date  . Hypertension   . Diabetes mellitus   . Blind   . Gangrene   . Hemorrhoids   . Fistula   . Fournier's gangrene - s/p I&D May 2012 x2 02/19/2011    Past Surgical History  Procedure Date  . Surgery on gangrene May 6/7/8, 2012 (x 3 debridements)    Fournier's Gangrene of perineum  . Retinal detachment surgery   . Hemorrhoid surgery     revision  . Eye surgery     Family History  Problem Relation Age of Onset  . Diabetes Mother   . Hypertension Mother   . Cancer Mother     pancreas  . Diabetes Father   . Hypertension Father   . Diabetes Brother   . Diabetes Sister     History  Substance Use Topics  . Smoking status: Never Smoker   . Smokeless tobacco: Never Used  . Alcohol Use: 0.0 oz/week    8-10 Cans of beer per week     Comment: weekends      Review of Systems  Allergies  Review of patient's allergies indicates no known allergies.  Home Medications   Current Outpatient Rx  Name  Route  Sig  Dispense  Refill  . AMLODIPINE BESYLATE 10 MG PO TABS   Oral   Take 10 mg by mouth daily.          . ASPIRIN EC 81 MG PO TBEC   Oral   Take 81 mg  by mouth daily.         Marland Kitchen EZETIMIBE-SIMVASTATIN 10-10 MG PO TABS   Oral   Take 0.5 tablets by mouth daily.          Marland Kitchen GLIPIZIDE XL 10 MG PO TB24   Oral   Take 10 mg by mouth daily.          . INSULIN DETEMIR 100 UNIT/ML [place] SOLN   Subcutaneous   Inject 12-14 Units into the skin at bedtime.         Marland Kitchen LISINOPRIL-HYDROCHLOROTHIAZIDE 20-25 MG PO TABS   Oral   Take 1 tablet by mouth daily.          Marland Kitchen METFORMIN HCL ER 500 MG PO TB24   Oral   Take 2,000 mg by mouth at bedtime.          . ADULT MULTIVITAMIN W/MINERALS CH   Oral   Take 1 tablet by mouth daily.         . AMOXICILLIN-POT CLAVULANATE 875-125 MG PO TABS   Oral   Take 1 tablet by mouth 2 (two) times daily. One po bid x 7 days   14 tablet  0     BP 118/76  Pulse 64  Temp 97.8 F (36.6 C) (Oral)  Resp 18  SpO2 97%  Physical Exam  Nursing note and vitals reviewed. Constitutional: He is oriented to person, place, and time. He appears well-developed and well-nourished.  HENT:  Head: Normocephalic and atraumatic.  Right Ear: External ear normal.  Left Ear: External ear normal.  Eyes: EOM are normal.       Cornea are opaque, bilateral  Neck: Normal range of motion and phonation normal. Neck supple.  Cardiovascular: Normal rate, regular rhythm, normal heart sounds and intact distal pulses.   Pulmonary/Chest: Effort normal and breath sounds normal. He exhibits no bony tenderness.  Abdominal: Soft. Normal appearance. There is no tenderness.  Genitourinary:       There are postoperative changes of the right buttock, adjacent to the anus. Within this area is a slightly swollen area that is draining a serosanguineous material this area is slightly fluctuant. There is no significant tenderness of the perianal tissue.  Musculoskeletal: Normal range of motion.  Neurological: He is alert and oriented to person, place, and time. He has normal strength. No cranial nerve deficit or sensory deficit. He exhibits  normal muscle tone. Coordination normal.  Skin: Skin is warm, dry and intact.  Psychiatric: He has a normal mood and affect. His behavior is normal. Judgment and thought content normal.    ED Course  Procedures (including critical care time)  INCISION AND DRAINAGE Performed by: Flint Melter Consent: Verbal consent obtained. Risks and benefits: risks, benefits and alternatives were discussed Type: abscess  Body area: Right buttock   Anesthesia: local infiltration  Incision was made with a scalpel.  Local anesthetic: lidocaine 1% without epinephrine  Anesthetic total: 5 ml  Complexity: complex Blunt dissection to break up loculations- no pus encountered. Additionally, needle aspiration used to search for focus of infection in the prior wound; none found  Drainage: None Drainage amount: None     Patient tolerance: Patient tolerated the procedure well with no immediate complications.    Labs Reviewed  BASIC METABOLIC PANEL - Abnormal; Notable for the following:    Glucose, Bld 185 (*)     GFR calc non Af Amer 62 (*)     GFR calc Af Amer 72 (*)     All other components within normal limits  CBC WITH DIFFERENTIAL  URINALYSIS, ROUTINE W REFLEX MICROSCOPIC  URINE CULTURE   Nursing notes, applicable records and vitals reviewed.  Radiologic Images/Reports reviewed.    1. Abscess       MDM  Minor drainage, without finding for significant abscess. He had transient drainage, indicating a very superficial abscess that decompressed itself. He has mild elevation of glucose. He does not have systemic illness. I doubt that this will progress to a significant perianal or perirectal abscess. Noted that the patient is to have tenesmus. Doubt metabolic instability, serious bacterial infection or impending vascular collapse; the patient is stable for discharge.    Plan: Home Medications- Augmentin; Home Treatments- warm soaks; Recommended follow up- PCP one week for checkup  and return here if needed     Flint Melter, MD 08/30/12 [phone]

## 2012-08-30 NOTE — ED Notes (Signed)
Bed:WA08<BR> Expected date:<BR> Expected time:<BR> Means of arrival:<BR> Comments:<BR> CLOSED

## 2012-08-30 NOTE — ED Notes (Signed)
Pt w/ hx of rectal surgery for gangernous area at rectum that developed in 4 day period. This new area is at the same place as previous surgery.

## 2012-08-31 LAB — URINE CULTURE
Colony Count: NO GROWTH
Culture: NO GROWTH

## 2017-03-06 ENCOUNTER — Emergency Department (HOSPITAL_BASED_OUTPATIENT_CLINIC_OR_DEPARTMENT_OTHER)
Admission: EM | Admit: 2017-03-06 | Discharge: 2017-03-06 | Disposition: A | Discharge status: Home / Self Care | Payer: Medicare Other | Attending: Emergency Medicine | Admitting: Emergency Medicine

## 2017-03-06 ENCOUNTER — Encounter (HOSPITAL_BASED_OUTPATIENT_CLINIC_OR_DEPARTMENT_OTHER): Payer: Self-pay | Admitting: Emergency Medicine

## 2017-03-06 ENCOUNTER — Emergency Department (HOSPITAL_BASED_OUTPATIENT_CLINIC_OR_DEPARTMENT_OTHER): Payer: Medicare Other

## 2017-03-06 DIAGNOSIS — H81399 Other peripheral vertigo, unspecified ear: Secondary | ICD-10-CM | POA: Insufficient documentation

## 2017-03-06 DIAGNOSIS — R42 Dizziness and giddiness: Secondary | ICD-10-CM

## 2017-03-06 DIAGNOSIS — Z794 Long term (current) use of insulin: Secondary | ICD-10-CM | POA: Diagnosis not present

## 2017-03-06 DIAGNOSIS — E119 Type 2 diabetes mellitus without complications: Secondary | ICD-10-CM | POA: Insufficient documentation

## 2017-03-06 DIAGNOSIS — I10 Essential (primary) hypertension: Secondary | ICD-10-CM | POA: Diagnosis not present

## 2017-03-06 DIAGNOSIS — Z7984 Long term (current) use of oral hypoglycemic drugs: Secondary | ICD-10-CM | POA: Insufficient documentation

## 2017-03-06 DIAGNOSIS — H547 Unspecified visual loss: Secondary | ICD-10-CM | POA: Insufficient documentation

## 2017-03-06 LAB — URINALYSIS, ROUTINE W REFLEX MICROSCOPIC
BILIRUBIN URINE: NEGATIVE
Glucose, UA: NEGATIVE mg/dL
Hgb urine dipstick: NEGATIVE
KETONES UR: NEGATIVE mg/dL
Leukocytes, UA: NEGATIVE
NITRITE: NEGATIVE
PH: 7 (ref 5.0–8.0)
Protein, ur: NEGATIVE mg/dL
SPECIFIC GRAVITY, URINE: 1.013 (ref 1.005–1.030)

## 2017-03-06 LAB — CBC WITH DIFFERENTIAL/PLATELET
BASOS PCT: 0 %
Basophils Absolute: 0 10*3/uL (ref 0.0–0.1)
EOS ABS: 0.1 10*3/uL (ref 0.0–0.7)
EOS PCT: 1 %
HCT: 41.9 % (ref 39.0–52.0)
HEMOGLOBIN: 14 g/dL (ref 13.0–17.0)
Lymphocytes Relative: 31 %
Lymphs Abs: 1.9 10*3/uL (ref 0.7–4.0)
MCH: 27.8 pg (ref 26.0–34.0)
MCHC: 33.4 g/dL (ref 30.0–36.0)
MCV: 83.3 fL (ref 78.0–100.0)
MONOS PCT: 10 %
Monocytes Absolute: 0.6 10*3/uL (ref 0.1–1.0)
NEUTROS PCT: 58 %
Neutro Abs: 3.6 10*3/uL (ref 1.7–7.7)
PLATELETS: 220 10*3/uL (ref 150–400)
RBC: 5.03 MIL/uL (ref 4.22–5.81)
RDW: 14.4 % (ref 11.5–15.5)
WBC: 6.2 10*3/uL (ref 4.0–10.5)

## 2017-03-06 LAB — COMPREHENSIVE METABOLIC PANEL
ALBUMIN: 4 g/dL (ref 3.5–5.0)
ALK PHOS: 47 U/L (ref 38–126)
ALT: 17 U/L (ref 17–63)
AST: 17 U/L (ref 15–41)
Anion gap: 9 (ref 5–15)
BUN: 13 mg/dL (ref 6–20)
CALCIUM: 9.5 mg/dL (ref 8.9–10.3)
CHLORIDE: 101 mmol/L (ref 101–111)
CO2: 28 mmol/L (ref 22–32)
Creatinine, Ser: 1.21 mg/dL (ref 0.61–1.24)
GFR calc Af Amer: >60 mL/min (ref >60)
GFR calc non Af Amer: 59 mL/min — ABNORMAL LOW (ref >60)
GLUCOSE: 144 mg/dL — AB (ref 65–99)
POTASSIUM: 3.8 mmol/L (ref 3.5–5.1)
SODIUM: 138 mmol/L (ref 135–145)
Total Bilirubin: 0.7 mg/dL (ref 0.3–1.2)
Total Protein: 7.6 g/dL (ref 6.5–8.1)

## 2017-03-06 LAB — TROPONIN I: Troponin I: <0.03 ng/mL (ref <0.03)

## 2017-03-06 MED ORDER — MECLIZINE HCL 25 MG PO TABS
25.0000 mg | ORAL_TABLET | Freq: Once | ORAL | Status: AC
  Administered 2017-03-06: 25 mg via ORAL
  Filled 2017-03-06: qty 1

## 2017-03-06 MED ORDER — MECLIZINE HCL 12.5 MG PO TABS: 12.5000 mg | ORAL_TABLET | Freq: Three times a day (TID) | ORAL | 0 refills | Status: DC | PRN

## 2017-03-06 NOTE — ED Notes (Signed)
This Clinical research associatewriter and the nurse Marlana SalvageKalia ambulated this pt. to the restroom

## 2017-03-06 NOTE — ED Notes (Signed)
Contacted Dr. Otelia LimesLindzen @ [phone]

## 2017-03-06 NOTE — ED Triage Notes (Signed)
Pt c/o sudden onset dizziness at approx 1730 while lifting weights at gym; dizziness continues, but denies other sxs

## 2017-03-06 NOTE — ED Provider Notes (Signed)
[facility] Provider Note   CSN: [id number] Arrival date & time: 03/06/17  1859   By signing my name below, I, Clarisse GougeXavier Herndon, attest that this documentation has been prepared under the direction and in the presence of Tilden Fossaees, Elye Harmsen, MD. Electronically signed, Clarisse GougeXavier Herndon, ED Scribe. 03/06/17. 7:48 PM.   History   Chief Complaint Chief Complaint  Patient presents with  . Dizziness   The history is provided by the patient, medical records and a relative. No language interpreter was used.    Jose Roberts is a 71 y.o. male with h/o blindness presenting to the Emergency Department concerning dizziness onset at the gym shortly PTA. He states he had just finished bench pressing when he began experiencing presenting dizziness. He also reports runny stools today. Abdominal discomfort not described as painful or nasea reported; he states this is worse with standing and ambulation. He describes a room spinning, off balance sensation of dizziness. He states he has not had much water, he had 2 cups coffee ~5:30 and a diet soda for lunch, another diet soda before the gym. He states he does not drink soda often. H/o DM and HTN noted. No h/o stroke. Pt reports he had his blood pressure taken by EMS PTA and reports an initial pressure ~164 systolic and another soon after while the pt was seated that measured ~137/110. Pt reports occasional dizziness and SOB while laying on his back recently. No numbness, weakness, N/V, chest pain, SOB, headache or ear pain. No other complaints at this time.   Past Medical History:  Diagnosis Date  . Blind   . Diabetes mellitus   . Fistula   . Fournier's gangrene - s/p I&D May 2012 x2 02/19/2011  . Gangrene (HCC)   . Hemorrhoids   . Hypertension     There are no active problems to display for this patient.   Past Surgical History:  Procedure Laterality Date  . EYE SURGERY    . HEMORRHOID SURGERY     revision  . RETINAL DETACHMENT SURGERY    .  surgery on gangrene  May 6/7/8, 2012 (x 3 debridements)   Fournier's Gangrene of perineum       Home Medications    Prior to Admission medications   Medication Sig Start Date End Date Taking? Authorizing Provider  amLODipine (NORVASC) 10 MG tablet Take 10 mg by mouth daily.  05/09/11   [provider]  amoxicillin-clavulanate (AUGMENTIN) 875-125 MG per tablet Take 1 tablet by mouth 2 (two) times daily. One po bid x 7 days 08/30/12   Mancel BaleWentz, Elliott, MD  aspirin EC 81 MG tablet Take 81 mg by mouth daily.    [provider]  ezetimibe-simvastatin (VYTORIN) 10-10 MG per tablet Take 0.5 tablets by mouth daily.     [provider]  GLIPIZIDE XL 10 MG 24 hr tablet Take 10 mg by mouth daily.  02/19/11   [provider]  insulin detemir (LEVEMIR) 100 UNIT/ML injection Inject 12-14 Units into the skin at bedtime.    [provider]  lisinopril-hydrochlorothiazide (PRINZIDE,ZESTORETIC) 20-25 MG per tablet Take 1 tablet by mouth daily.  05/09/11   [provider]  meclizine (ANTIVERT) 12.5 MG tablet Take 1 tablet (12.5 mg total) by mouth 3 (three) times daily as needed for dizziness. 03/06/17   Tilden Fossaees, Hayzel Ruberg, MD  metFORMIN (GLUCOPHAGE-XR) 500 MG 24 hr tablet Take 2,000 mg by mouth at bedtime.  05/09/11   [provider]  Multiple Vitamin (MULTIVITAMIN WITH  MINERALS) TABS Take 1 tablet by mouth daily.    [provider]    Family History Family History  Problem Relation Age of Onset  . Diabetes Mother   . Hypertension Mother   . Cancer Mother        pancreas  . Diabetes Father   . Hypertension Father   . Diabetes Brother   . Diabetes Sister     Social History Social History  Substance Use Topics  . Smoking status: Never Smoker  . Smokeless tobacco: Never Used  . Alcohol use 7.2 oz/week    12 Cans of beer per week     Comment: per week     Allergies   Patient has no known allergies.   Review of Systems Review of  Systems  Constitutional: Negative for chills, diaphoresis and fever.  HENT: Negative for facial swelling.   Respiratory: Negative for shortness of breath.   Cardiovascular: Negative for chest pain.  Gastrointestinal: Negative for abdominal pain, nausea and vomiting.  Genitourinary: Negative for difficulty urinating.  Musculoskeletal: Positive for gait problem.  Skin: Negative for wound.  Neurological: Positive for dizziness. Negative for syncope, weakness, numbness and headaches.  Psychiatric/Behavioral: Negative for confusion.  All other systems reviewed and are negative.    Physical Exam Updated Vital Signs BP (!) 168/96   Pulse 98   Temp 97.9 F (36.6 C) (Oral)   Resp 18   Ht 5\' 11"  (1.803 m)   Wt 265 lb (120.2 kg)   SpO2 97%   BMI 36.96 kg/m   Physical Exam  Constitutional: He is oriented to person, place, and time. He appears well-developed and well-nourished.  HENT:  Head: Normocephalic and atraumatic.  Eyes:  Cataracts to eyes bilaterally  Cardiovascular: Normal rate and regular rhythm.   No murmur heard. Pulmonary/Chest: Effort normal and breath sounds normal. No respiratory distress.  Abdominal: Soft. There is no tenderness. There is no rebound and no guarding.  Musculoskeletal: He exhibits no edema or tenderness.  Neurological: He is alert and oriented to person, place, and time.  No asymmetry of facial muscles. Unable to test for revision, pupillary reflex or nystagmus. Sensation to light touch intact throughout face, arms, legs. 5 out of 5 strength in bilateral upper and lower extremities. No ataxia on heel-to-shin bilaterally. Rapid alternating movements intact in bilateral upper extremities.  Skin: Skin is warm and dry.  Psychiatric: He has a normal mood and affect. His behavior is normal.  Nursing note and vitals reviewed.    ED Treatments / Results  DIAGNOSTIC STUDIES: Oxygen Saturation is 97% on RA, NL by my interpretation.    COORDINATION OF  CARE: 7:46 PM-Discussed next steps with pt. Pt verbalized understanding and is agreeable with the plan. Will order labs.   Labs (all labs ordered are listed, but only abnormal results are displayed) Labs Reviewed  COMPREHENSIVE METABOLIC PANEL - Abnormal; Notable for the following:       Result Value   Glucose, Bld 144 (*)    GFR calc non Af Amer 59 (*)    All other components within normal limits  CBC WITH DIFFERENTIAL/PLATELET  TROPONIN I  URINALYSIS, ROUTINE W REFLEX MICROSCOPIC    EKG  EKG Interpretation  Date/Time:  Wednesday March 06 2017 19:07:14 EDT Ventricular Rate:  87 PR Interval:  144 QRS Duration: 104 QT Interval:  386 QTC Calculation: 464 R Axis:   -21 Text Interpretation:  Normal sinus rhythm with sinus arrhythmia Possible Anterior infarct , age undetermined Abnormal  ECG Confirmed by Tilden Fossaees, Wyat Infinger ([id number]) on 03/06/2017 8:46:58 PM       Radiology Ct Head Wo Contrast  Result Date: 03/06/2017 CLINICAL DATA:  Dizziness. EXAM: CT HEAD WITHOUT CONTRAST TECHNIQUE: Contiguous axial images were obtained from the base of the skull through the vertex without intravenous contrast. COMPARISON:  None. FINDINGS: Brain: Mild diffuse cortical atrophy is noted. Mild chronic ischemic white matter disease is noted. No mass effect or midline shift is noted. Ventricular size is within normal limits. There is no evidence of mass lesion, hemorrhage or acute infarction. Vascular: No hyperdense vessel or unexpected calcification. Skull: Normal. Negative for fracture or focal lesion. Sinuses/Orbits: Left sphenoid sinusitis is noted. Other: None. IMPRESSION: Mild diffuse cortical atrophy. Mild chronic ischemic white matter disease. Chronic left sphenoid sinusitis. No acute intracranial abnormality seen. Electronically Signed   By: Lupita RaiderJames  Green Jr, M.D.   On: 03/06/2017 20:30    Procedures Procedures (including critical care time)  Medications Ordered in ED Medications  meclizine (ANTIVERT)  tablet 25 mg (25 mg Oral Given 03/06/17 1956)     Initial Impression / Assessment and Plan / ED Course  I have reviewed the triage vital signs and the nursing notes.  Pertinent labs & imaging results that were available during my care of the patient were reviewed by me and considered in my medical decision making (see chart for details).     Patient here for evaluation of dizziness described as a vertiginous sensation that began while bench pressing. His symptoms have improved in the emergency Department and completely resolved on repeat assessment. Presentation is not consistent with CVA, dissection, subarachnoid hemorrhage. He is able to ambulate on his baseline in the ED. Plan to DC home with outpatient follow-up and return precautions.  Final Clinical Impressions(s) / ED Diagnoses   Final diagnoses:  Vertigo    New Prescriptions Discharge Medication List as of 03/06/2017 10:37 PM    START taking these medications   Details  meclizine (ANTIVERT) 12.5 MG tablet Take 1 tablet (12.5 mg total) by mouth 3 (three) times daily as needed for dizziness., Starting Wed 03/06/2017, Print      I personally performed the services described in this documentation, which was scribed in my presence. The recorded information has been reviewed and is accurate.    Tilden Fossaees, Britley Gashi, MD 03/06/17 [phone]

## 2017-11-06 ENCOUNTER — Encounter: Payer: Self-pay | Admitting: Family Medicine

## 2017-11-06 ENCOUNTER — Ambulatory Visit: Payer: Medicare Other | Admitting: Family Medicine

## 2017-11-06 VITALS — BP 132/78 | HR 85 | Temp 97.4°F | Ht 71.0 in | Wt 275.5 lb

## 2017-11-06 DIAGNOSIS — R430 Anosmia: Secondary | ICD-10-CM | POA: Diagnosis not present

## 2017-11-06 DIAGNOSIS — G4700 Insomnia, unspecified: Secondary | ICD-10-CM | POA: Insufficient documentation

## 2017-11-06 MED ORDER — FLUTICASONE PROPIONATE 50 MCG/ACT NA SUSP: 2.0000 | Freq: Every day | NASAL | 6 refills | Status: DC

## 2017-11-06 NOTE — Progress Notes (Signed)
Pre visit review using our clinic review tool, if applicable. No additional management support is needed unless otherwise documented below in the visit note. 

## 2017-11-06 NOTE — Patient Instructions (Signed)
Sleep Hygiene Tips:  Do not watch TV or look at screens within 1 hour of going to bed. If you do, make sure there is a blue light filter (nighttime mode) involved.  Try to go to bed around the same time every night. Wake up at the same time within 1 hour of regular time. Ex: If you wake up at 7 AM for work, do not sleep past 8 AM on days that you don't work.  Do not drink alcohol before bedtime.  Do not consume caffeine-containing beverages after noon or within 9 hours of intended bedtime.  Get regular exercise/physical activity in your life, but not within 2 hours of planned bedtime.  Do not take naps.   Do not eat within 2 hours of planned bedtime.  Melatonin, 3-5 mg 30-60 minutes before planned bedtime may be helpful.   The bed should be for sleep or sex only. If after 20-30 minutes you are unable to fall asleep, get up and do something relaxing. Do this until you feel ready to go to sleep again.   Sleep is important to us all. Getting good sleep is imperative to adequate functioning during the day. Work with our counselors who are trained to help people obtain quality sleep. Call [phone] to schedule an appointment or if you are curious about insurance coverage/cost.  

## 2017-11-06 NOTE — Progress Notes (Signed)
Chief Complaint  Patient presents with  . Establish Care       New Patient Visit SUBJECTIVE: HPI: Jose Roberts is an 72 y.o.male who is being seen for establishing care.  The [facility].  DM 2- 27 u Levemir at night, OK to go up to 80 u at once. Has trouble checking sugars. Diet could be helpful. Lifting weights, some walking. Metformin XR 2 g/d.  After experiencing a cold a couple months ago, the patient has had very poor smelling.  He did not inhale any noxious materials.  There is no injury to his head or nose.  He does not feel congested or have any runny nose right now.  His sense of taste is also decreased.  He has not tried anything to help this.  He also has a history of insomnia.  Most the time when he takes 10 mg of melatonin nightly, this helps him fall asleep.  Around 10% the time, it does not work and he struggles.  He has to wake up at 4:30 in the morning.  He does not nap during the day.  He is physically active by going to the gym walking and lifting weights.  He does not describe himself as an anxious individual.   No Known Allergies  Past Medical History:  Diagnosis Date  . Blind   . Diabetes mellitus   . Fistula   . Fournier's gangrene - s/p I&D May 2012 x2 02/19/2011  . Gangrene (HCC)   . Hemorrhoids   . Hypertension    Past Surgical History:  Procedure Laterality Date  . EYE SURGERY    . HEMORRHOID SURGERY     revision  . RETINAL DETACHMENT SURGERY    . surgery on gangrene  May 6/7/8, 2012 (x 3 debridements)   Fournier's Gangrene of perineum   Social History   Socioeconomic History  . Marital status: Married   Family History  Problem Relation Age of Onset  . Diabetes Mother   . Hypertension Mother   . Cancer Mother        pancreas  . Diabetes Father   . Hypertension Father   . Diabetes Brother   . Diabetes Sister      Current Outpatient Medications:  .  amLODipine (NORVASC) 10 MG tablet, Take 10 mg by mouth  daily. , Disp: , Rfl:  .  aspirin EC 81 MG tablet, Take 81 mg by mouth daily., Disp: , Rfl:  .  hydrochlorothiazide (HYDRODIURIL) 25 MG tablet, Take 25 mg by mouth daily., Disp: , Rfl:  .  insulin detemir (LEVEMIR) 100 UNIT/ML injection, Inject 12-14 Units into the skin at bedtime., Disp: , Rfl:  .  losartan (COZAAR) 100 MG tablet, Take 100 mg by mouth daily., Disp: , Rfl:  .  metFORMIN (GLUCOPHAGE-XR) 500 MG 24 hr tablet, Take 2,000 mg by mouth at bedtime. , Disp: , Rfl:  .  Multiple Vitamin (MULTIVITAMIN WITH MINERALS) TABS, Take 1 tablet by mouth daily., Disp: , Rfl:  .  fluticasone (FLONASE) 50 MCG/ACT nasal spray, Place 2 sprays into both nostrils daily., Disp: 16 g, Rfl: 6  ROS Cardiovascular: Denies chest pain  Respiratory: Denies dyspnea   OBJECTIVE: BP 132/78 (BP Location: Left Arm, Patient Position: Sitting, Cuff Size: Large)   Pulse 85   Temp (!) 97.4 F (36.3 C) (Oral)   Ht 5\' 11"  (1.803 m)   Wt 275 lb 8 oz (125 kg)   SpO2 96%  BMI 38.42 kg/m   Constitutional: -  VS reviewed -  Well developed, well nourished, appears stated age -  No apparent distress  Psychiatric: -  Oriented to person, place, and time -  Memory intact -  Affect and mood normal -  Fluent conversation, good eye contact -  Judgment and insight age appropriate  ENMT: -  MMM    Pharynx moist, no exudate, no erythema  Neck: -  No gross swelling, no palpable masses -  Thyroid midline, not enlarged, mobile, no palpable masses  Cardiovascular: -  RRR -  No LE edema  Respiratory: -  Normal respiratory effort, no accessory muscle use, no retraction -  Breath sounds equal, no wheezes, no ronchi, no crackles  Gastrointestinal: -  Bowel sounds normal -  No tenderness, no distention, no guarding, no masses  Skin: -  No significant lesion on inspection -  Warm and dry to palpation   ASSESSMENT/PLAN: Anosmia - Plan: fluticasone (FLONASE) 50 MCG/ACT nasal spray  Insomnia, unspecified type  Patient  instructed to sign release of records form from his previous PCP. Flonase for 2 months.  If no improvement, will refer to ENT. Continue melatonin, sleep hygiene information and contact number for [facility] behavioral health team given. Patient should return in around 1 mo for a CPE. The patient voiced understanding and agreement to the plan.   Jilda Roche [place], DO 11/06/17  5:05 PM

## 2017-11-25 ENCOUNTER — Telehealth: Payer: Self-pay | Admitting: Family Medicine

## 2017-11-25 NOTE — Telephone Encounter (Signed)
Pt needs his final PCV23. Imms updated in chart.  Colonoscopy next due 06/12/25; last done 06/13/15 Microalb/Cr was 12/22/2016 A1c 12/22/16 Foot exam is due. Eye exam?  RE- please update his colonoscopy. TY.

## 2017-11-25 NOTE — Telephone Encounter (Signed)
updated

## 2018-01-31 ENCOUNTER — Encounter: Payer: Self-pay | Admitting: Family Medicine

## 2018-01-31 ENCOUNTER — Ambulatory Visit (INDEPENDENT_AMBULATORY_CARE_PROVIDER_SITE_OTHER): Payer: Medicare Other | Admitting: Family Medicine

## 2018-01-31 VITALS — BP 140/78 | HR 92 | Temp 98.1°F | Ht 71.0 in | Wt 268.2 lb

## 2018-01-31 DIAGNOSIS — Z23 Encounter for immunization: Secondary | ICD-10-CM | POA: Diagnosis not present

## 2018-01-31 DIAGNOSIS — Z794 Long term (current) use of insulin: Secondary | ICD-10-CM

## 2018-01-31 DIAGNOSIS — N529 Male erectile dysfunction, unspecified: Secondary | ICD-10-CM | POA: Insufficient documentation

## 2018-01-31 DIAGNOSIS — Z0000 Encounter for general adult medical examination without abnormal findings: Secondary | ICD-10-CM | POA: Diagnosis not present

## 2018-01-31 DIAGNOSIS — R430 Anosmia: Secondary | ICD-10-CM | POA: Diagnosis not present

## 2018-01-31 DIAGNOSIS — Z1159 Encounter for screening for other viral diseases: Secondary | ICD-10-CM

## 2018-01-31 DIAGNOSIS — E669 Obesity, unspecified: Secondary | ICD-10-CM | POA: Insufficient documentation

## 2018-01-31 DIAGNOSIS — E1169 Type 2 diabetes mellitus with other specified complication: Secondary | ICD-10-CM | POA: Insufficient documentation

## 2018-01-31 DIAGNOSIS — E118 Type 2 diabetes mellitus with unspecified complications: Secondary | ICD-10-CM | POA: Diagnosis not present

## 2018-01-31 LAB — LIPID PANEL
Cholesterol: 155 mg/dL (ref 0–200)
HDL: 50.9 mg/dL (ref >39.00)
LDL Cholesterol: 89 mg/dL (ref 0–99)
NonHDL: 103.97
TRIGLYCERIDES: 77 mg/dL (ref 0.0–149.0)
Total CHOL/HDL Ratio: 3
VLDL: 15.4 mg/dL (ref 0.0–40.0)

## 2018-01-31 LAB — CBC
HEMATOCRIT: 44.4 % (ref 39.0–52.0)
Hemoglobin: 14.7 g/dL (ref 13.0–17.0)
MCHC: 33.1 g/dL (ref 30.0–36.0)
MCV: 84.8 fl (ref 78.0–100.0)
Platelets: 251 10*3/uL (ref 150.0–400.0)
RBC: 5.24 Mil/uL (ref 4.22–5.81)
RDW: 14.1 % (ref 11.5–15.5)
WBC: 6.4 10*3/uL (ref 4.0–10.5)

## 2018-01-31 LAB — COMPREHENSIVE METABOLIC PANEL
ALK PHOS: 58 U/L (ref 39–117)
ALT: 11 U/L (ref 0–53)
AST: 12 U/L (ref 0–37)
Albumin: 4 g/dL (ref 3.5–5.2)
BUN: 12 mg/dL (ref 6–23)
CHLORIDE: 102 meq/L (ref 96–112)
CO2: 29 mEq/L (ref 19–32)
Calcium: 9.9 mg/dL (ref 8.4–10.5)
Creatinine, Ser: 1.12 mg/dL (ref 0.40–1.50)
GFR: 83.22 mL/min (ref >60.00)
GLUCOSE: 146 mg/dL — AB (ref 70–99)
POTASSIUM: 3.8 meq/L (ref 3.5–5.1)
Sodium: 140 mEq/L (ref 135–145)
TOTAL PROTEIN: 7.3 g/dL (ref 6.0–8.3)
Total Bilirubin: 0.8 mg/dL (ref 0.2–1.2)

## 2018-01-31 LAB — HEMOGLOBIN A1C: Hgb A1c MFr Bld: 9.3 % — ABNORMAL HIGH (ref 4.6–6.5)

## 2018-01-31 MED ORDER — LOSARTAN POTASSIUM 100 MG PO TABS: 100.0000 mg | ORAL_TABLET | Freq: Every day | ORAL | 2 refills | Status: DC

## 2018-01-31 MED ORDER — INSULIN DETEMIR 100 UNIT/ML ~~LOC~~ SOLN: 12.0000 [IU] | Freq: Every day | SUBCUTANEOUS | 1 refills | Status: DC

## 2018-01-31 MED ORDER — SILDENAFIL CITRATE 100 MG PO TABS: 50.0000 mg | ORAL_TABLET | Freq: Every day | ORAL | 1 refills | Status: DC | PRN

## 2018-01-31 MED ORDER — HYDROCHLOROTHIAZIDE 25 MG PO TABS: 25.0000 mg | ORAL_TABLET | Freq: Every day | ORAL | 2 refills | Status: DC

## 2018-01-31 NOTE — Patient Instructions (Addendum)
Call your pharmacy to see about the availability of the new shingles vaccine (Shingrix).  Check blood pressures at home. Let me know if it gets too high. Take 1/2 tab of Norvasc until we see each other next.   If you do not hear anything about your referral in the next 1-2 weeks, call our office and ask for an update.  Stay active, keep the diet clean.  Let us know if you need anything.

## 2018-01-31 NOTE — Progress Notes (Signed)
Pre visit review using our clinic review tool, if applicable. No additional management support is needed unless otherwise documented below in the visit note. 

## 2018-01-31 NOTE — Progress Notes (Signed)
Chief Complaint  Patient presents with  . Follow-up    Well Male Jose Roberts is here for a complete physical.   His last physical was >1 year ago.  Current diet: in general, a "healthy" diet.   Current exercise: not much exercise since fall since 2.5 mo ago Weight trend: stable Does pt snore? No apneic episodes. Seat belt? Yes.    I saw him around 3 months ago and he was diagnosed with anosmia.  I placed him on an intranasal corticosteroid which was helpful until he got an upper restaurant infection.  That was around 1 month ago and since that time his smelling is gotten much worse despite using the Flonase.  Patient also reports erectile dysfunction.  He would like something to help with this.  Health maintenance Colonoscopy- Yes Tetanus- Yes Hep C- No Prostate cancer screening- No Pneumonia vaccine- No  Past Medical History:  Diagnosis Date  . Blind   . Diabetes mellitus   . Fistula   . Fournier's gangrene - s/p I&D May 2012 x2 02/19/2011  . Gangrene (HCC)   . Hemorrhoids   . Hypertension      Past Surgical History:  Procedure Laterality Date  . EYE SURGERY    . HEMORRHOID SURGERY     revision  . RETINAL DETACHMENT SURGERY    . surgery on gangrene  May 6/7/8, 2012 (x 3 debridements)   Fournier's Gangrene of perineum    Medications  Current Outpatient Medications on File Prior to Visit  Medication Sig Dispense Refill  . amLODipine (NORVASC) 10 MG tablet Take 10 mg by mouth daily.     Marland Kitchen aspirin EC 81 MG tablet Take 81 mg by mouth daily.    . fluticasone (FLONASE) 50 MCG/ACT nasal spray Place 2 sprays into both nostrils daily. 16 g 6  . hydrochlorothiazide (HYDRODIURIL) 25 MG tablet Take 25 mg by mouth daily.    . insulin detemir (LEVEMIR) 100 UNIT/ML injection Inject 12-14 Units into the skin at bedtime.    Marland Kitchen losartan (COZAAR) 100 MG tablet Take 100 mg by mouth daily.    . metFORMIN (GLUCOPHAGE-XR) 500 MG 24 hr tablet Take 2,000 mg by mouth at bedtime.     .  Multiple Vitamin (MULTIVITAMIN WITH MINERALS) TABS Take 1 tablet by mouth daily.     Allergies No Known Allergies  Family History Family History  Problem Relation Age of Onset  . Diabetes Mother   . Hypertension Mother   . Cancer Mother        pancreas  . Diabetes Father   . Hypertension Father   . Diabetes Brother   . Diabetes Sister     Review of Systems: Constitutional:  no fevers or chills Eye:  no eye pain Ear/Nose/Mouth/Throat:  Ears:  no recent hearing loss Nose/Mouth/Throat:  +anosmia; no complaints of sore throat Cardiovascular:  no chest pain, no palpitations Respiratory:  no cough and no shortness of breath Gastrointestinal:  no abdominal pain, no change in bowel habits GU:  Male: +ED; negative for dysuria, frequency, and incontinence and negative for prostate symptoms Musculoskeletal/Extremities:  no pain, redness, or swelling of the joints Integumentary (Skin):  no abnormal skin lesions reported Neurologic:  no headaches, Endocrine:  No unexpected weight changes Hematologic/Lymphatic:  no areas of easy bruising  Exam BP 140/78 (BP Location: Left Arm, Patient Position: Sitting, Cuff Size: Large)   Pulse 92   Temp 98.1 F (36.7 C) (Oral)   Ht 5\' 11"  (1.803 m)  Wt 268 lb 4 oz (121.7 kg)   SpO2 92%   BMI 37.41 kg/m  General:  well developed, well nourished, in no apparent distress Skin:  no significant moles, warts, or growths Head:  no masses, lesions, or tenderness Eyes:  pupils equal and round, sclera anicteric without injection Ears:  canals without lesions, TMs shiny without retraction, no obvious effusion, no erythema Nose:  nares patent, septum midline, mucosa normal Throat/Pharynx:  lips and gingiva without lesion; tongue and uvula midline; non-inflamed pharynx; no exudates or postnasal drainage Neck: neck supple without adenopathy, thyromegaly, or masses Lungs:  clear to auscultation, breath sounds equal bilaterally, no respiratory  distress Cardio:  regular rate and rhythm, no LE edema or bruits Abdomen:  abdomen soft, nontender; bowel sounds normal; no masses or organomegaly Genital (male): circumcised penis, no lesions or discharge; testes present bilaterally without masses or tenderness Rectal: Deferred Musculoskeletal:  symmetrical muscle groups noted without atrophy or deformity Extremities:  no clubbing, cyanosis, or edema, no deformities, no skin discoloration Neuro:  gait normal; deep tendon reflexes normal and symmetric Psych: well oriented with normal range of affect and appropriate judgment/insight  Assessment and Plan  Well adult exam - Plan: Comprehensive metabolic panel, Lipid panel  Controlled type 2 diabetes mellitus with complication, with long-term current use of insulin (HCC) - Plan: CBC, Hemoglobin A1c  Encounter for hepatitis C screening test for low risk patient - Plan: Hepatitis C antibody  Erectile dysfunction, unspecified erectile dysfunction type  Anosmia - Plan: Ambulatory referral to ENT  Need for vaccination against Streptococcus pneumoniae - Plan: Pneumococcal polysaccharide vaccine 23-valent greater than or equal to 2yo subcutaneous/IM   Well 72 y.o. male. Counseled on diet and exercise. Continue INCS until getting in w ENT. Trial Viagra. Final PCV.  Other orders as above. Follow up in 3 mo for DM visit.  The patient voiced understanding and agreement to the plan.  Jilda Rocheicholas Paul MulberryWendling, DO 01/31/18 7:47 AM

## 2018-02-01 LAB — HEPATITIS C ANTIBODY
Hepatitis C Ab: NONREACTIVE
SIGNAL TO CUT-OFF: 0.03 (ref <1.00)

## 2018-02-02 ENCOUNTER — Other Ambulatory Visit: Payer: Self-pay | Admitting: Family Medicine

## 2018-02-02 ENCOUNTER — Encounter: Payer: Self-pay | Admitting: Family Medicine

## 2018-02-02 MED ORDER — PIOGLITAZONE HCL 30 MG PO TABS: 30.0000 mg | ORAL_TABLET | Freq: Every day | ORAL | 3 refills | Status: DC

## 2018-02-05 ENCOUNTER — Telehealth: Payer: Self-pay | Admitting: Family Medicine

## 2018-02-05 ENCOUNTER — Other Ambulatory Visit: Payer: Self-pay | Admitting: Family Medicine

## 2018-02-05 MED ORDER — GLUCOSE BLOOD VI STRP: ORAL_STRIP | 1 refills | Status: DC

## 2018-02-05 NOTE — Telephone Encounter (Signed)
Copied from CRM [id number]. Topic: Quick Communication - See Telephone Encounter >> Feb 05, 2018  9:09 AM Trula SladeWalter, Linda F wrote: CRM for notification. See Telephone encounter for: 02/05/18. Patient stated that [facility] but he needs the pin needles.  [facility] said they need a new prescription sent in to them, but the patient said to PLEASE don't send in the request until this Friday, February 07, 2018.

## 2018-02-10 MED ORDER — PEN NEEDLES 32G X 4 MM MISC: 12.0000 [IU] | Freq: Every day | 0 refills | Status: DC

## 2018-02-14 ENCOUNTER — Telehealth: Payer: Self-pay | Admitting: Family Medicine

## 2018-02-14 NOTE — Telephone Encounter (Signed)
Copied from CRM [id number]. Topic: General - Other >> Feb 14, 2018  7:16 AM Leafy Roobinson, Norma J wrote: Reason for CRM: pt is calling and he needs levemir flex pens not insulin sent to [facility]

## 2018-02-14 NOTE — Telephone Encounter (Signed)
Pens sent in.

## 2018-02-18 NOTE — Telephone Encounter (Signed)
Patient is calling and states the pharmacy is telling him that Levemir Flex pens were not sent in. Patient would like a call back once this has resent. Please advise.

## 2018-02-19 ENCOUNTER — Other Ambulatory Visit: Payer: Self-pay

## 2018-02-19 MED ORDER — PEN NEEDLES 32G X 4 MM MISC: 12.0000 [IU] | Freq: Every day | 0 refills | Status: DC

## 2018-02-20 ENCOUNTER — Telehealth: Payer: Self-pay

## 2018-02-20 MED ORDER — PEN NEEDLES 32G X 4 MM MISC: 12.0000 [IU] | Freq: Every day | 0 refills | Status: DC

## 2018-02-20 NOTE — Telephone Encounter (Signed)
Author re-sent insulin pen needle rx to [facility] per pt. Request. Author phoned [facility] to confirm receipt of rx request, but they could not see the request. Author then spoke with pharmacist from [facility], and verbally gave orders for levemir pen needles as well as levemir insulin itself, because according to the pharmacist, they never received the 6/21 request for the insulin. Author phoned pt. To relay status, left detailed VM on home phone.

## 2018-02-21 ENCOUNTER — Other Ambulatory Visit: Payer: Self-pay | Admitting: Family Medicine

## 2018-03-03 ENCOUNTER — Telehealth: Payer: Self-pay | Admitting: Family Medicine

## 2018-03-03 ENCOUNTER — Other Ambulatory Visit: Payer: Self-pay | Admitting: Family Medicine

## 2018-03-03 NOTE — Telephone Encounter (Signed)
Sent in pens

## 2018-03-03 NOTE — Telephone Encounter (Signed)
Copied from CRM [id number]. Topic: Quick Communication - Rx Refill/Question >> Mar 03, 2018  5:35 PM Avie ArenasSimmons, Hiawatha Merriott L, NT wrote: Medication: amLODipine (NORVASC) 10 MG tablet  patient would like a 90 supply  Has the patient contacted their pharmacy? yes (Agent: If no, request that the patient contact the pharmacy for the refill. (Agent: If yes, when and what did the pharmacy advise?  Preferred Pharmacy (with phone number or street name):[facility] [address] - [place], [place] - [id number] [phone] (Phone) [phone] (Fax)      Agent: Please be advised that RX refills may take up to 3 business days. We ask that you follow-up with your pharmacy.

## 2018-03-03 NOTE — Telephone Encounter (Signed)
Copied from CRM [id number]. Topic: Quick Communication - Rx Refill/Question >> Mar 03, 2018  5:34 PM Avie ArenasSimmons, Phillis Thackeray L, NT wrote: Patient called and would like 27 units of  insulin detemir (LEVEMIR) 100 UNIT/ML injection instead of 12, he [facility] [place] - [id number] [phone])

## 2018-03-03 NOTE — Telephone Encounter (Signed)
Copied from CRM [id number]. Topic: General - Other >> Feb 14, 2018  7:16 AM Leafy Roobinson, Norma J wrote: Reason for CRM: pt is calling and he needs levemir flex pens not insulin >> Feb 20, 2018  9:15 AM Cipriano BunkerLambe, Annette S wrote: Please call Mr Onalee HuaDavid and leave message about the pens today [phone]  Or call after 4 pm on cell

## 2018-03-04 MED ORDER — AMLODIPINE BESYLATE 10 MG PO TABS: 5.0000 mg | ORAL_TABLET | Freq: Every day | ORAL | 1 refills | Status: DC

## 2018-03-04 NOTE — Telephone Encounter (Signed)
Has he been using 27? If so and no lows, OK, if we are jumping from 12 to 27, that might not be the safest option. TY.

## 2018-03-04 NOTE — Telephone Encounter (Signed)
Rx sent 

## 2018-03-04 NOTE — Telephone Encounter (Signed)
Please advise 

## 2018-03-05 MED ORDER — INSULIN DETEMIR 100 UNIT/ML ~~LOC~~ SOLN: 27.0000 [IU] | Freq: Every day | SUBCUTANEOUS | 0 refills | Status: DC

## 2018-03-05 NOTE — Addendum Note (Signed)
Addended by: Scharlene GlossEWING, ROBIN B on: 03/05/2018 11:46 AM   Modules accepted: Orders

## 2018-03-05 NOTE — Telephone Encounter (Signed)
Spoke to the patient and he has been using 27 units and sugars are around 132 am. Sent in refill Updated list.

## 2018-05-01 ENCOUNTER — Other Ambulatory Visit: Payer: Self-pay | Admitting: Family Medicine

## 2018-05-09 ENCOUNTER — Encounter: Payer: Self-pay | Admitting: Family Medicine

## 2018-05-09 ENCOUNTER — Ambulatory Visit: Payer: Medicare Other | Admitting: Family Medicine

## 2018-05-09 ENCOUNTER — Other Ambulatory Visit: Payer: Self-pay

## 2018-05-09 VITALS — BP 146/76 | HR 94 | Temp 97.8°F | Resp 16 | Ht 71.0 in | Wt 269.0 lb

## 2018-05-09 DIAGNOSIS — Z23 Encounter for immunization: Secondary | ICD-10-CM

## 2018-05-09 DIAGNOSIS — B353 Tinea pedis: Secondary | ICD-10-CM | POA: Diagnosis not present

## 2018-05-09 DIAGNOSIS — E1169 Type 2 diabetes mellitus with other specified complication: Secondary | ICD-10-CM

## 2018-05-09 DIAGNOSIS — G4700 Insomnia, unspecified: Secondary | ICD-10-CM

## 2018-05-09 DIAGNOSIS — E669 Obesity, unspecified: Secondary | ICD-10-CM

## 2018-05-09 LAB — MICROALBUMIN / CREATININE URINE RATIO
Creatinine,U: 86.3 mg/dL
Microalb Creat Ratio: 0.8 mg/g (ref 0.0–30.0)

## 2018-05-09 LAB — HEMOGLOBIN A1C: Hgb A1c MFr Bld: 9 % — ABNORMAL HIGH (ref 4.6–6.5)

## 2018-05-09 MED ORDER — KETOCONAZOLE 2 % EX CREA: 1.0000 "application " | TOPICAL_CREAM | Freq: Every day | CUTANEOUS | 0 refills | Status: AC

## 2018-05-09 MED ORDER — TRAZODONE HCL 50 MG PO TABS: 25.0000 mg | ORAL_TABLET | Freq: Every evening | ORAL | 3 refills | Status: DC | PRN

## 2018-05-09 NOTE — Progress Notes (Signed)
Subjective:   Chief Complaint  Patient presents with  . Follow-up    DM, trouble staying asleep    Jose Roberts is a 72 y.o. male here for follow-up of diabetes.   Jose Roberts's self monitored glucose range is 80-90's. Patient denies hypoglycemic reactions. He checks his glucose levels 1 time per day. Patient does require insulin.   Medications include: Metformin Reports diet is unhealthy overall during the summer. Patient is starting to exercise again and enjoy s walking.   Insomnia 2-3 mo, has been having difficulty staying asleep. Will go to bed around 10 PM and wake up 2-3 AM being unable to fall asleep. He does not fall back asleep and does not feel well rested. 10 mg melatonin is somewhat helpful, but not in staying asleep. No issues with depression or anxiety. No new meds, has not been ill.   Past Medical History:  Diagnosis Date  . Blind   . Diabetes mellitus   . Fistula   . Fournier's gangrene - s/p I&D May 2012 x2 02/19/2011  . Hemorrhoids   . Hypertension     Past Surgical History:  Procedure Laterality Date  . EYE SURGERY    . HEMORRHOID SURGERY     revision  . RETINAL DETACHMENT SURGERY    . surgery on gangrene  May 6/7/8, 2012 (x 3 debridements)   Fournier's Gangrene of perineum        Related testing: Date of retinal exam: Pt is blind Pneumovax: done Flu Shot: done  Review of Systems: Pulmonary:  No SOB Cardiovascular:  No chest pain  Objective:  BP (!) 146/76 (BP Location: Left Arm, Patient Position: Sitting, Cuff Size: Large)   Pulse 94   Temp 97.8 F (36.6 C) (Oral)   Resp 16   Ht 5\' 11"  (1.803 m)   Wt 269 lb (122 kg)   SpO2 94%   BMI 37.52 kg/m  General:  Well developed, well nourished, in no apparent distress Skin:  Warm, no pallor or diaphoresis Head:  Normocephalic, atraumatic Eyes:  Lens are opacified b/l Lungs:  CTAB, no access msc use Cardio:  RRR, no bruits, 1+ pitting bilateral LE edema up to proximal third of tibia   Musculoskeletal:  Symmetrical muscle groups noted without atrophy or deformity Neuro:  Sensation intact to pinprick on feet Psych: Age appropriate judgment and insight  Assessment:   Diabetes mellitus type 2 in obese (HCC) - Plan: Hemoglobin A1c, Microalbumin / creatinine urine ratio, HM DIABETES FOOT EXAM  Insomnia, unspecified type - Plan: traZODone (DESYREL) 50 MG tablet  Tinea pedis of both feet - Plan: ketoconazole (NIZORAL) 2 % cream  Need for vaccination for H flu type B - Plan: Flu vaccine HIGH DOSE PF (Fluzone High dose)   Plan:   Orders as above. Counseled on diet and exercise. He changed his diet and sugar readings are much lower, we will see what his A1c is before changing medication.  With these readings, I would be very hesitant to change his insulin. Sleep hygiene discussed and written down.  Number for [facility] provided and AVS.  Trial trazodone. Treat with topical fungal. F/u in 3 mo. The patient voiced understanding and agreement to the plan.  Jilda Roche Bayard, DO 05/09/18 9:52 AM

## 2018-05-09 NOTE — Progress Notes (Signed)
Needs 63mo supplies on all meds per pt.

## 2018-05-09 NOTE — Patient Instructions (Addendum)
Sleep is important to [place] all. Getting good sleep is imperative to adequate functioning during the day. Work with our counselors who are trained to help people obtain quality sleep. Call [phone] to schedule an appointment or if you are curious about insurance coverage/cost.  Sleep Hygiene Tips:  Do not watch TV or look at screens within 1 hour of going to bed. If you do, make sure there is a blue light filter (nighttime mode) involved.  Try to go to bed around the same time every night. Wake up at the same time within 1 hour of regular time. Ex: If you wake up at 7 AM for work, do not sleep past 8 AM on days that you don't work.  Do not drink alcohol before bedtime.  Do not consume caffeine-containing beverages after noon or within 9 hours of intended bedtime.  Get regular exercise/physical activity in your life, but not within 2 hours of planned bedtime.  Do not take naps.   Do not eat within 2 hours of planned bedtime.  Melatonin, 3-5 mg 30-60 minutes before planned bedtime may be helpful.   The bed should be for sleep or sex only. If after 20-30 minutes you are unable to fall asleep, get up and do something relaxing. Do this until you feel ready to go to sleep again.   Aim to do some physical exertion for 150 minutes per week. This is typically divided into 5 days per week, 30 minutes per day. The activity should be enough to get your heart rate up. Anything is better than nothing if you have time constraints.  Keep the diet as clean as able.  Follow up for diabetes will be based on what your A1c is.   Let us know if you need anything.

## 2018-05-10 ENCOUNTER — Encounter: Payer: Self-pay | Admitting: Family Medicine

## 2018-05-12 MED ORDER — METFORMIN HCL ER 500 MG PO TB24: 2000.0000 mg | ORAL_TABLET | Freq: Every day | ORAL | 3 refills | Status: DC

## 2018-05-16 ENCOUNTER — Encounter: Payer: Self-pay | Admitting: Family Medicine

## 2018-05-20 ENCOUNTER — Encounter: Payer: Self-pay | Admitting: Family Medicine

## 2018-05-20 MED ORDER — HYDROCHLOROTHIAZIDE 25 MG PO TABS: 25.0000 mg | ORAL_TABLET | Freq: Every day | ORAL | 2 refills | Status: DC

## 2018-05-30 ENCOUNTER — Telehealth: Payer: Self-pay | Admitting: Family Medicine

## 2018-05-30 NOTE — Telephone Encounter (Signed)
Will send in trulicity but need to know what dose you want him to start on.

## 2018-05-30 NOTE — Telephone Encounter (Signed)
Pt given results per Dr Carmelia Roller; "DM improved but still not well controlled. Let's see if we can get Trulicity ordered and a payment card. Please order and provide card to patient. TY."; he verbalizes understanding spoke with Jasmine December at [facility] regarding this issue; the pt says he is at work and says that a detailed message can be left on his home phone [phone]; will route to office for notification; unable to chart in result note because no encounter created.

## 2018-06-02 NOTE — Telephone Encounter (Signed)
Will send in trulicity but need to know what dose you want to start him on

## 2018-06-03 MED ORDER — DULAGLUTIDE 0.75 MG/0.5ML ~~LOC~~ SOAJ: SUBCUTANEOUS | 2 refills | Status: DC

## 2018-06-03 NOTE — Telephone Encounter (Signed)
Notified pt and he voices understanding. Rx sent to [facility] and co-pay card has been placed at the front desk for him to pick up.  He states he was told at his last OV that he could remain off of Actos because he reported that it made him "feel funny" when he took it. Is it ok to remove from med list?  Also, pt states his wife is a Engineer, civil (consulting) and can help him with his Trulicity injections as he has never given himself injections before. Offered pt to schedule nurse visit in our office injection teaching but he would like to check with his spouse first. If she is unfamiliar or unsure about giving medication then pt will schedule nurse visit in our office. Please advise.

## 2018-06-03 NOTE — Addendum Note (Signed)
Addended by: Mervin Kung A on: 06/03/2018 04:55 PM   Modules accepted: Orders

## 2018-06-03 NOTE — Telephone Encounter (Signed)
0.75 mg per week for 2 weeks then 1.5 mg weekly. TY.

## 2018-06-04 ENCOUNTER — Telehealth: Payer: Self-pay | Admitting: Family Medicine

## 2018-06-04 NOTE — Telephone Encounter (Signed)
Have not received notification from Pt's pharmacy that Trulicity needs PA.

## 2018-06-04 NOTE — Telephone Encounter (Signed)
Actos removed.

## 2018-06-04 NOTE — Addendum Note (Signed)
Addended by: Radene Gunning on: 06/04/2018 07:08 AM   Modules accepted: Orders

## 2018-06-04 NOTE — Telephone Encounter (Signed)
Pharmacy calling to advise the Rx will need a new script for only 2 weeks.  This Rx was written with 2 refills, and instructions state differently.  Pt only supposed to do the .75 for 2 weeks. Then back to 1.5 on 3rd week.  Please advise.  [facility] [address] [place], [place] - [address] [phone] (Phone) [phone] (Fax)

## 2018-06-04 NOTE — Telephone Encounter (Signed)
Copied from CRM [id number]. Topic: Quick Communication - See Telephone Encounter >> Jun 04, 2018  2:21 PM Terisa Starr wrote: CRM for notification. See Telephone encounter for: 06/04/18.  Patient's wife wants to know if Dulaglutide (TRULICITY) 0.75 MG/0.5ML SOPN had a prior approval on it by medicare. She would like the nurse to call her back. Patient's wife would like the nurse to call her back. [phone]

## 2018-06-05 ENCOUNTER — Telehealth: Payer: Self-pay

## 2018-06-05 MED ORDER — DULAGLUTIDE 0.75 MG/0.5ML ~~LOC~~ SOAJ: SUBCUTANEOUS | 0 refills | Status: DC

## 2018-06-05 NOTE — Telephone Encounter (Signed)
Received tier exception request form from [facility]- form completed and faxed to [phone]. Awaiting determination.

## 2018-06-05 NOTE — Telephone Encounter (Signed)
Refill done.  

## 2018-06-05 NOTE — Telephone Encounter (Signed)
Tier exception denied. Trulicity does not qualify for lower tier/copay.

## 2018-06-07 ENCOUNTER — Other Ambulatory Visit: Payer: Self-pay | Admitting: Family Medicine

## 2018-06-09 ENCOUNTER — Encounter: Payer: Self-pay | Admitting: Family Medicine

## 2018-06-09 MED ORDER — AMLODIPINE BESYLATE 10 MG PO TABS: 10.0000 mg | ORAL_TABLET | Freq: Every day | ORAL | 1 refills | Status: DC

## 2018-06-19 ENCOUNTER — Other Ambulatory Visit: Payer: Self-pay | Admitting: Family Medicine

## 2018-07-29 ENCOUNTER — Telehealth: Payer: Self-pay | Admitting: Family Medicine

## 2018-07-29 NOTE — Telephone Encounter (Signed)
Pharmacy needs a new script for Trulicity 1.5 mg so insurance will cover.  They will not cover 2 shots of the 0.75 mg to get the same dose

## 2018-07-30 ENCOUNTER — Telehealth: Payer: Self-pay | Admitting: Family Medicine

## 2018-07-30 MED ORDER — DULAGLUTIDE 1.5 MG/0.5ML ~~LOC~~ SOAJ: 1.5000 mg | SUBCUTANEOUS | 3 refills | Status: DC

## 2018-07-30 NOTE — Telephone Encounter (Signed)
Copied from CRM [phone]. Topic: Quick Communication - See Telephone Encounter >> Jul 30, 2018  3:00 PM Angela NevinWilliams, Candice N wrote: CRM for notification. See Telephone encounter for: 07/30/18.  Augusto GarbeBaily, [facility], calling to clarify quantity of Dulaglutide (TRULICITY) 1.5 MG/0.5ML SOPN. Please advise.   Cb# [phone]

## 2018-07-30 NOTE — Telephone Encounter (Signed)
OK to change. Ty.

## 2018-07-30 NOTE — Telephone Encounter (Signed)
Haily, with [facility], calling to clarify quantity of Dulaglutide (TRULICITY) 1.5 MG/0.5ML SOPN. Please advise.  States only one pen. Cb# [phone]

## 2018-07-30 NOTE — Telephone Encounter (Signed)
Called pharmacy gave verbal ok to fill for one month

## 2018-08-04 ENCOUNTER — Other Ambulatory Visit: Payer: Self-pay | Admitting: Family Medicine

## 2018-08-22 ENCOUNTER — Encounter: Payer: Self-pay | Admitting: Family Medicine

## 2018-08-22 ENCOUNTER — Ambulatory Visit: Payer: Medicare Other | Admitting: Family Medicine

## 2018-08-22 ENCOUNTER — Other Ambulatory Visit: Payer: Self-pay | Admitting: Family Medicine

## 2018-08-22 VITALS — BP 136/80 | HR 92 | Temp 97.8°F | Ht 71.0 in | Wt 274.5 lb

## 2018-08-22 DIAGNOSIS — E669 Obesity, unspecified: Secondary | ICD-10-CM | POA: Diagnosis not present

## 2018-08-22 DIAGNOSIS — E1169 Type 2 diabetes mellitus with other specified complication: Secondary | ICD-10-CM | POA: Diagnosis not present

## 2018-08-22 DIAGNOSIS — I10 Essential (primary) hypertension: Secondary | ICD-10-CM | POA: Insufficient documentation

## 2018-08-22 LAB — COMPREHENSIVE METABOLIC PANEL
ALK PHOS: 50 U/L (ref 39–117)
ALT: 13 U/L (ref 0–53)
AST: 14 U/L (ref 0–37)
Albumin: 4.1 g/dL (ref 3.5–5.2)
BUN: 17 mg/dL (ref 6–23)
CHLORIDE: 102 meq/L (ref 96–112)
CO2: 29 meq/L (ref 19–32)
Calcium: 10.1 mg/dL (ref 8.4–10.5)
Creatinine, Ser: 1.19 mg/dL (ref 0.40–1.50)
GFR: 77.47 mL/min (ref >60.00)
GLUCOSE: 92 mg/dL (ref 70–99)
Potassium: 4 mEq/L (ref 3.5–5.1)
SODIUM: 140 meq/L (ref 135–145)
TOTAL PROTEIN: 7 g/dL (ref 6.0–8.3)
Total Bilirubin: 0.5 mg/dL (ref 0.2–1.2)

## 2018-08-22 LAB — LIPID PANEL
CHOL/HDL RATIO: 3
Cholesterol: 119 mg/dL (ref 0–200)
HDL: 40.5 mg/dL (ref >39.00)
LDL CALC: 68 mg/dL (ref 0–99)
NonHDL: 78.22
Triglycerides: 49 mg/dL (ref 0.0–149.0)
VLDL: 9.8 mg/dL (ref 0.0–40.0)

## 2018-08-22 LAB — HEMOGLOBIN A1C: HEMOGLOBIN A1C: 8.8 % — AB (ref 4.6–6.5)

## 2018-08-22 MED ORDER — EMPAGLIFLOZIN 25 MG PO TABS: 25.0000 mg | ORAL_TABLET | Freq: Every day | ORAL | 3 refills | Status: DC

## 2018-08-22 NOTE — Progress Notes (Signed)
Pre visit review using our clinic review tool, if applicable. No additional management support is needed unless otherwise documented below in the visit note. 

## 2018-08-22 NOTE — Progress Notes (Signed)
Added Jardiance 

## 2018-08-22 NOTE — Progress Notes (Signed)
Subjective:   Chief Complaint  Patient presents with  . Follow-up    Jose Roberts is a 73 y.o. male here for follow-up of diabetes.   Jose Roberts's self monitored glucose range is low 100's.  Patient denies hypoglycemic reactions. He checks his glucose levels 1 time per day. Patient does require insulin.   Medications include: Levemir 30 u daily, Trulicity 1.5 mg/week, Metformin 2000 mg/d; did not tolerate Trulicity Exercise: tries to walk Diet was not good over past 6-7 weeks.  Hypertension Patient presents for hypertension follow up. He does monitor home blood pressures. Blood pressures ranging on average from 130-140's/90's. He is compliant with medications- HCTZ 25 mg/d, Norvasc. Patient has these side effects of medication: none He is adhering to a healthy diet overall. Exercise: none currently   Past Medical History:  Diagnosis Date  . Blind   . Diabetes mellitus   . Fistula   . Fournier's gangrene - s/p I&D May 2012 x2 02/19/2011  . Hemorrhoids   . Hypertension      Related testing: Date of retinal exam: Pt is blind Pneumovax: done Flu Shot: done  Review of Systems: Pulmonary:  No SOB Cardiovascular:  No chest pain  Objective:  BP 136/80 (BP Location: Left Arm, Patient Position: Sitting, Cuff Size: Large)   Pulse 92   Temp 97.8 F (36.6 C) (Oral)   Ht 5\' 11"  (1.803 m)   Wt 274 lb 8 oz (124.5 kg)   SpO2 95%   BMI 38.28 kg/m  General:  Well developed, well nourished, in no apparent distress Head:  Normocephalic, atraumatic Eyes:  Pupils equal and round, sclera anicteric without injection  Lungs:  CTAB, no access msc use Cardio:  RRR, no bruits, 2+ pitting b/l LE edema up to prox 1/3 of tibia Psych: Age appropriate judgment and insight  Assessment:   Diabetes mellitus type 2 in obese (HCC) - Plan: Hemoglobin A1c, Lipid panel, Comprehensive metabolic panel  Essential hypertension   Plan:   #1- uncontrolled- Stop GLP-1, pt does not wish for any more  "pokes" so we will avoid this class. Consider SGLT-2 inhibitor or adjusting insulin pending A1c. Counseled on diet and exercise. #2- Controlled for his age. Bring in BP monitor to next visit. Cont current meds for now.  F/u in 3 mo. The patient voiced understanding and agreement to the plan.  Jilda Rocheicholas Paul [employer], DO 08/22/18 7:30 AM

## 2018-08-22 NOTE — Patient Instructions (Addendum)
Keep the diet clean and stay active. Get back on the wagon.  Give [place] 2-3 business days to get the results of your labs back.   Let us know if you need anything.

## 2018-10-15 ENCOUNTER — Telehealth: Payer: Self-pay | Admitting: Family Medicine

## 2018-10-15 MED ORDER — METFORMIN HCL ER 500 MG PO TB24: 2000.0000 mg | ORAL_TABLET | Freq: Every day | ORAL | 3 refills | Status: DC

## 2018-10-15 MED ORDER — AMLODIPINE BESYLATE 10 MG PO TABS: 10.0000 mg | ORAL_TABLET | Freq: Every day | ORAL | 1 refills | Status: DC

## 2018-10-15 MED ORDER — EMPAGLIFLOZIN 25 MG PO TABS: 25.0000 mg | ORAL_TABLET | Freq: Every day | ORAL | 3 refills | Status: DC

## 2018-10-15 MED ORDER — LOSARTAN POTASSIUM 100 MG PO TABS: 100.0000 mg | ORAL_TABLET | Freq: Every day | ORAL | 2 refills | Status: DC

## 2018-10-15 MED ORDER — INSULIN DETEMIR 100 UNIT/ML FLEXPEN: 30.0000 [IU] | PEN_INJECTOR | Freq: Every day | SUBCUTANEOUS | 1 refills | Status: DC

## 2018-10-15 MED ORDER — HYDROCHLOROTHIAZIDE 25 MG PO TABS: 25.0000 mg | ORAL_TABLET | Freq: Every day | ORAL | 2 refills | Status: DC

## 2018-10-15 NOTE — Telephone Encounter (Signed)
Sent in refills///called the patient left detailed message refills requested are done.

## 2018-10-15 NOTE — Telephone Encounter (Signed)
Copied from CRM [id number]. Topic: Quick Communication - See Telephone Encounter >> Oct 15, 2018  4:36 PM Jose Roberts wrote: CRM for notification. See Telephone encounter for: 10/15/18.  Patient states that he was in the office January 10th and thought Dr Carmelia Roller was going to send in all his medications. They have no record of this. He will be out of his Metformin in about 2 days. Please Advise.  [facility] [address] [place], [place] - [address] [place] [address] Phone: [phone] Fax: [phone]

## 2018-10-28 ENCOUNTER — Encounter: Payer: Self-pay | Admitting: Family Medicine

## 2018-10-28 MED ORDER — EMPAGLIFLOZIN 25 MG PO TABS: 25.0000 mg | ORAL_TABLET | Freq: Every day | ORAL | 1 refills | Status: DC

## 2018-11-03 ENCOUNTER — Other Ambulatory Visit: Payer: Self-pay | Admitting: Family Medicine

## 2018-11-03 MED ORDER — EMPAGLIFLOZIN 25 MG PO TABS: 25.0000 mg | ORAL_TABLET | Freq: Every day | ORAL | 1 refills | Status: DC

## 2018-11-24 ENCOUNTER — Telehealth: Payer: Self-pay | Admitting: *Deleted

## 2018-11-24 NOTE — Telephone Encounter (Signed)
Copied from CRM [id number]. Topic: Quick Communication - Appointment Cancellation >> Nov 21, 2018 10:01 AM Jose Roberts wrote: Patient called to cancel appointment scheduled for 12/03/18.  Patient HAS NOT rescheduled their appointment.  Patient stated that he would like to reschedule for later in the year.  Please call patient back at [phone]  Route to department's PEC pool.

## 2018-11-24 NOTE — Telephone Encounter (Signed)
Spoke with pt. He is blind and unable to do Virtual Visit. He prefers to r/s for a couple of months away. Appt r/s for 02/02/19 at 7am.

## 2018-11-27 ENCOUNTER — Ambulatory Visit: Payer: Medicare Other | Admitting: Family Medicine

## 2018-12-02 ENCOUNTER — Other Ambulatory Visit: Payer: Self-pay | Admitting: Family Medicine

## 2018-12-02 MED ORDER — METFORMIN HCL ER 500 MG PO TB24: 2000.0000 mg | ORAL_TABLET | Freq: Every day | ORAL | 3 refills | Status: DC

## 2018-12-03 ENCOUNTER — Ambulatory Visit: Payer: Medicare Other | Admitting: Family Medicine

## 2019-01-02 ENCOUNTER — Other Ambulatory Visit: Payer: Self-pay | Admitting: Family Medicine

## 2019-02-04 ENCOUNTER — Ambulatory Visit: Payer: Medicare Other | Admitting: Family Medicine

## 2019-02-04 ENCOUNTER — Encounter: Payer: Self-pay | Admitting: Family Medicine

## 2019-02-04 ENCOUNTER — Other Ambulatory Visit: Payer: Self-pay

## 2019-02-04 VITALS — BP 124/80 | HR 79 | Temp 98.1°F | Ht 71.0 in | Wt 267.0 lb

## 2019-02-04 DIAGNOSIS — E669 Obesity, unspecified: Secondary | ICD-10-CM | POA: Diagnosis not present

## 2019-02-04 DIAGNOSIS — N529 Male erectile dysfunction, unspecified: Secondary | ICD-10-CM

## 2019-02-04 DIAGNOSIS — E1169 Type 2 diabetes mellitus with other specified complication: Secondary | ICD-10-CM

## 2019-02-04 DIAGNOSIS — I10 Essential (primary) hypertension: Secondary | ICD-10-CM | POA: Diagnosis not present

## 2019-02-04 MED ORDER — SILDENAFIL CITRATE 100 MG PO TABS: ORAL_TABLET | ORAL | 2 refills | Status: DC

## 2019-02-04 NOTE — Patient Instructions (Signed)
Give us 2-3 business days to get the results of your labs back.   Keep the diet clean and stay active.  Let us know if you need anything. 

## 2019-02-04 NOTE — Progress Notes (Signed)
Subjective:   Chief Complaint  Patient presents with  . Follow-up    Jose Roberts is a 73 y.o. male here for follow-up of diabetes.   Calder's self monitored glucose range is 80-120's.  Patient denies hypoglycemic reactions. He checks his glucose levels 3-4x/week Patient does require insulin.   Medications include: Metformin 2000 mg/d XR, Jardiance 25 mg/d he had to stop. Diet is "not bad" Exercise: walking  Hypertension Patient presents for hypertension follow up. He does monitor home blood pressures. Blood pressures ranging on average from 130's/80's. He is compliant with medications HCTZ 25 mg/d, losartan 100 mg/d, Norvasc 10 mg/d. Patient has these side effects of medication: none He is usually adhering to a healthy diet overall. Exercise: walking  ED Hx of ED, uses Viagra sparingly. No AE's, needs a refill.  Past Medical History:  Diagnosis Date  . Blind   . Diabetes mellitus   . Fistula   . Fournier's gangrene - s/p I&D May 2012 x2 02/19/2011  . Hemorrhoids   . Hypertension      Related testing: Date of retinal exam: Done Pneumovax: done Flu Shot: done  Review of Systems: Pulmonary:  No SOB Cardiovascular:  No chest pain  Objective:  BP 124/80 (BP Location: Left Arm, Patient Position: Sitting, Cuff Size: Large)   Pulse 79   Temp 98.1 F (36.7 C) (Oral)   Ht 5\' 11"  (1.803 m)   Wt 267 lb (121.1 kg)   SpO2 94%   BMI 37.24 kg/m  General:  Well developed, well nourished, in no apparent distress Skin:  Warm, no pallor or diaphoresis Head:  Normocephalic, atraumatic Eyes:  Pupils equal and round, sclera anicteric without injection  Lungs:  CTAB, no access msc use Cardio:  RRR, no bruits, 1+ pitting b/l LE edema up to prox 1/3 of tibia Psych: Age appropriate judgment and insight  Assessment:   Diabetes mellitus type 2 in obese ([place]) - Plan: Comprehensive metabolic panel, Hemoglobin A1c, Lipid panel; will add Januvia if not controlled  Essential  hypertension - Plan: Cont meds  Erectile dysfunction, unspecified erectile dysfunction type - Plan: Cont Viagra   Plan:   Orders as above. Counseled on diet and exercise. F/u in 6 mo mo, earliest convenience for labs. The patient voiced understanding and agreement to the plan.  [place], DO 02/04/19 7:26 AM

## 2019-02-10 ENCOUNTER — Other Ambulatory Visit: Payer: Self-pay

## 2019-02-10 ENCOUNTER — Other Ambulatory Visit (INDEPENDENT_AMBULATORY_CARE_PROVIDER_SITE_OTHER): Payer: Medicare Other

## 2019-02-10 DIAGNOSIS — E1169 Type 2 diabetes mellitus with other specified complication: Secondary | ICD-10-CM | POA: Diagnosis not present

## 2019-02-10 DIAGNOSIS — E669 Obesity, unspecified: Secondary | ICD-10-CM

## 2019-02-11 ENCOUNTER — Other Ambulatory Visit: Payer: Self-pay | Admitting: Family Medicine

## 2019-02-11 LAB — COMPREHENSIVE METABOLIC PANEL
ALT: 11 U/L (ref 0–53)
AST: 11 U/L (ref 0–37)
Albumin: 4.1 g/dL (ref 3.5–5.2)
Alkaline Phosphatase: 60 U/L (ref 39–117)
BUN: 13 mg/dL (ref 6–23)
CO2: 25 mEq/L (ref 19–32)
Calcium: 9.5 mg/dL (ref 8.4–10.5)
Chloride: 102 mEq/L (ref 96–112)
Creatinine, Ser: 1.23 mg/dL (ref 0.40–1.50)
GFR: 70.07 mL/min (ref >60.00)
Glucose, Bld: 156 mg/dL — ABNORMAL HIGH (ref 70–99)
Potassium: 3.3 mEq/L — ABNORMAL LOW (ref 3.5–5.1)
Sodium: 137 mEq/L (ref 135–145)
Total Bilirubin: 0.7 mg/dL (ref 0.2–1.2)
Total Protein: 7.1 g/dL (ref 6.0–8.3)

## 2019-02-11 LAB — HEMOGLOBIN A1C: Hgb A1c MFr Bld: 8 % — ABNORMAL HIGH (ref 4.6–6.5)

## 2019-02-11 LAB — LIPID PANEL
Cholesterol: 134 mg/dL (ref 0–200)
HDL: 46.3 mg/dL (ref >39.00)
LDL Cholesterol: 76 mg/dL (ref 0–99)
NonHDL: 87.75
Total CHOL/HDL Ratio: 3
Triglycerides: 59 mg/dL (ref 0.0–149.0)
VLDL: 11.8 mg/dL (ref 0.0–40.0)

## 2019-02-17 ENCOUNTER — Other Ambulatory Visit: Payer: Self-pay | Admitting: Family Medicine

## 2019-02-17 ENCOUNTER — Telehealth: Payer: Self-pay | Admitting: Family Medicine

## 2019-02-17 DIAGNOSIS — E876 Hypokalemia: Secondary | ICD-10-CM

## 2019-02-17 NOTE — Telephone Encounter (Signed)
Copied from [place] [id number]. Topic: General - Other >> Feb 16, 2019  5:01 PM Alanda Slim E wrote: Reason for CRM: Pt would like Dr. Irene Limbo nurse to call him about his lab results/ please advise Have discussed results see result notes

## 2019-02-18 ENCOUNTER — Telehealth: Payer: Self-pay | Admitting: Family Medicine

## 2019-02-18 NOTE — Telephone Encounter (Signed)
Patient informed of medication instructions. (spoke to his wife). He also wanted to

## 2019-02-18 NOTE — Telephone Encounter (Signed)
Also he has a 90 day supply of Jardiance but stopped taking due to stomach issues---he wants to know if you want him to try this again?

## 2019-02-18 NOTE — Telephone Encounter (Signed)
Let's see how the sugars do on the increased dose of insulin. We may have him take 1/2 tab Jardiance should he be running high with his sugars. TY.

## 2019-02-18 NOTE — Telephone Encounter (Signed)
Please let pt know that he should not take that medication any further. Until a safe supply can be ensured, I want him to increase his daily insulin to 35 units nightly. Ty.

## 2019-02-18 NOTE — Telephone Encounter (Signed)
[place] and "yes" his is the one.

## 2019-02-18 NOTE — Addendum Note (Signed)
Addended by: Ames Coupe on: 02/18/2019 04:36 PM   Modules accepted: Orders

## 2019-02-18 NOTE — Telephone Encounter (Signed)
Patient informed of medication instructions.  

## 2019-02-18 NOTE — Telephone Encounter (Signed)
Can you contact pt's pharmacy to see if his Metformin ER rx is concerning for NMDA contamination please? I would like to know if so. Ty.

## 2019-02-20 NOTE — Telephone Encounter (Signed)
Those are good numbers. Have him check in again w readings in 2 weeks please. Ty.

## 2019-02-20 NOTE — Telephone Encounter (Signed)
Wednesday 111 Thursday 103 Today this am was 140 (he ate 2 to 3 bananas yesterday and will stop eating fruit)

## 2019-02-23 NOTE — Telephone Encounter (Signed)
Called left message to call back 

## 2019-02-23 NOTE — Telephone Encounter (Signed)
Patient informed and verbalized understanding

## 2019-02-24 ENCOUNTER — Other Ambulatory Visit (INDEPENDENT_AMBULATORY_CARE_PROVIDER_SITE_OTHER): Payer: Medicare Other

## 2019-02-24 ENCOUNTER — Other Ambulatory Visit: Payer: Self-pay

## 2019-02-24 DIAGNOSIS — E876 Hypokalemia: Secondary | ICD-10-CM

## 2019-02-24 NOTE — Progress Notes (Signed)
l °

## 2019-02-25 LAB — BASIC METABOLIC PANEL
BUN: 17 mg/dL (ref 6–23)
CO2: 27 mEq/L (ref 19–32)
Calcium: 9.9 mg/dL (ref 8.4–10.5)
Chloride: 103 mEq/L (ref 96–112)
Creatinine, Ser: 1.44 mg/dL (ref 0.40–1.50)
GFR: 58.41 mL/min — ABNORMAL LOW (ref >60.00)
Glucose, Bld: 205 mg/dL — ABNORMAL HIGH (ref 70–99)
Potassium: 3.9 mEq/L (ref 3.5–5.1)
Sodium: 138 mEq/L (ref 135–145)

## 2019-03-24 ENCOUNTER — Telehealth: Payer: Self-pay | Admitting: Family Medicine

## 2019-03-24 MED ORDER — PRODIGY NO CODING BLOOD GLUC VI STRP: ORAL_STRIP | 1 refills | Status: DC

## 2019-03-24 MED ORDER — LEVEMIR FLEXTOUCH 100 UNIT/ML ~~LOC~~ SOPN: 35.0000 [IU] | PEN_INJECTOR | Freq: Every day | SUBCUTANEOUS | 1 refills | Status: DC

## 2019-03-24 NOTE — Telephone Encounter (Signed)
Noted  

## 2019-03-24 NOTE — Telephone Encounter (Signed)
He started back on Jardiance and is taking 1/2 pill daily since going off metformin. He said BS are running between 113-120 since doing this. He has plenty of this until next appt 06/16/19.

## 2019-04-06 ENCOUNTER — Other Ambulatory Visit: Payer: Self-pay | Admitting: Family Medicine

## 2019-04-06 ENCOUNTER — Encounter: Payer: Self-pay | Admitting: Family Medicine

## 2019-04-06 MED ORDER — SITAGLIPTIN PHOSPHATE 100 MG PO TABS: 100.0000 mg | ORAL_TABLET | Freq: Every day | ORAL | 5 refills | Status: DC

## 2019-05-04 ENCOUNTER — Encounter: Payer: Self-pay | Admitting: Family Medicine

## 2019-05-05 ENCOUNTER — Encounter: Payer: Self-pay | Admitting: Family Medicine

## 2019-05-05 MED ORDER — SILDENAFIL CITRATE 100 MG PO TABS: ORAL_TABLET | ORAL | 2 refills | Status: DC

## 2019-05-15 ENCOUNTER — Encounter: Payer: Self-pay | Admitting: Family Medicine

## 2019-05-15 ENCOUNTER — Other Ambulatory Visit: Payer: Self-pay | Admitting: Family Medicine

## 2019-05-15 ENCOUNTER — Telehealth: Payer: Self-pay | Admitting: Family Medicine

## 2019-05-15 NOTE — Telephone Encounter (Signed)
Noted  

## 2019-05-15 NOTE — Telephone Encounter (Signed)
Copied from [place] [id number]. Topic: General - Other >> May 15, 2019  4:15 PM Keene Breath wrote: Reason for CRM: Patient request that the nurse call him regarding his diabetes medication.  He stated that it is too expensive and would like to be put back on Actose.   Please call to discuss at [phone]

## 2019-05-15 NOTE — Telephone Encounter (Signed)
Called the patient and his wife was told that Actos was very affordable.  So he is getting in the donut hole and [place] is too expensive. He has plenty of medication for the weekend and will call back  Next week to let us know the pharmacy to send it.

## 2019-05-18 ENCOUNTER — Telehealth: Payer: Self-pay

## 2019-05-18 ENCOUNTER — Other Ambulatory Visit: Payer: Self-pay

## 2019-05-18 MED ORDER — PIOGLITAZONE HCL 30 MG PO TABS: 30.0000 mg | ORAL_TABLET | Freq: Every day | ORAL | 0 refills | Status: DC

## 2019-05-18 NOTE — Telephone Encounter (Signed)
I thought we had discussed this w pt and we agreed to try pravastatin, but he wanted to wait? See if he is interested, please. This is a cholesterol lower med that will lower his risk of heart attack and stroke. Ty.

## 2019-05-18 NOTE — Telephone Encounter (Signed)
Copied from [place] [id number]. Topic: General - Other >> May 18, 2019 12:26 PM Leward Quan A wrote: Reason for CRM: Kirke Shaggy with [facility] called to say that patient have been flagged the patient as being eligible for a Statin medication because patient is between 45/and 72 yrs old. Asking that the provider review the patients medication and evaluate for lipid levels. A fax will also be sent in reference to this phone call. [phone] Ref# [id number]

## 2019-05-18 NOTE — Telephone Encounter (Signed)
Called the patient and he is ok with trying the pravastatin---please send to his local pharmacy to try first due to cost.

## 2019-05-19 MED ORDER — PRAVASTATIN SODIUM 40 MG PO TABS: 40.0000 mg | ORAL_TABLET | Freq: Every day | ORAL | 11 refills | Status: DC

## 2019-05-19 NOTE — Telephone Encounter (Signed)
Sent. This should be on $4 list at [facility].

## 2019-05-19 NOTE — Addendum Note (Signed)
Addended by: Ames Coupe on: 05/19/2019 07:11 AM   Modules accepted: Orders

## 2019-05-23 ENCOUNTER — Other Ambulatory Visit: Payer: Self-pay | Admitting: Family Medicine

## 2019-05-29 ENCOUNTER — Telehealth: Payer: Self-pay | Admitting: Family Medicine

## 2019-05-29 NOTE — Telephone Encounter (Signed)
Copied from [place] [id number]. Topic: General - Other >> May 29, 2019 12:09 PM Leward Quan A wrote: Reason for CRM: Whitney with [place] on behalf of [facility] called to speak to the clinical staff of Dr Nani Ravens asking for a call back to discuss medical adherence for the patient. She will also fax over forms to be reviewed and completed by Dr and returned. Ph# [phone] # [phone]   Have you seen this paperwork?  I have not

## 2019-06-01 NOTE — Telephone Encounter (Signed)
I have not. Is he taking his meds?

## 2019-06-02 NOTE — Telephone Encounter (Signed)
Have not seen these forms yet.

## 2019-06-15 ENCOUNTER — Ambulatory Visit: Payer: Medicare Other | Admitting: Family Medicine

## 2019-06-15 ENCOUNTER — Other Ambulatory Visit: Payer: Self-pay

## 2019-06-16 ENCOUNTER — Ambulatory Visit: Payer: Medicare Other | Admitting: Family Medicine

## 2019-06-16 ENCOUNTER — Other Ambulatory Visit: Payer: Self-pay

## 2019-06-16 ENCOUNTER — Encounter: Payer: Self-pay | Admitting: Family Medicine

## 2019-06-16 VITALS — BP 124/82 | HR 78 | Temp 97.5°F | Ht 71.0 in | Wt 261.1 lb

## 2019-06-16 DIAGNOSIS — E669 Obesity, unspecified: Secondary | ICD-10-CM | POA: Diagnosis not present

## 2019-06-16 DIAGNOSIS — E785 Hyperlipidemia, unspecified: Secondary | ICD-10-CM | POA: Diagnosis not present

## 2019-06-16 DIAGNOSIS — I10 Essential (primary) hypertension: Secondary | ICD-10-CM

## 2019-06-16 DIAGNOSIS — E1169 Type 2 diabetes mellitus with other specified complication: Secondary | ICD-10-CM | POA: Diagnosis not present

## 2019-06-16 DIAGNOSIS — B353 Tinea pedis: Secondary | ICD-10-CM

## 2019-06-16 LAB — COMPREHENSIVE METABOLIC PANEL WITH GFR
ALT: 15 U/L (ref 0–53)
AST: 14 U/L (ref 0–37)
Albumin: 4.1 g/dL (ref 3.5–5.2)
Alkaline Phosphatase: 71 U/L (ref 39–117)
BUN: 15 mg/dL (ref 6–23)
CO2: 28 meq/L (ref 19–32)
Calcium: 9.7 mg/dL (ref 8.4–10.5)
Chloride: 101 meq/L (ref 96–112)
Creatinine, Ser: 1.12 mg/dL (ref 0.40–1.50)
GFR: 77.99 mL/min
Glucose, Bld: 144 mg/dL — ABNORMAL HIGH (ref 70–99)
Potassium: 3.7 meq/L (ref 3.5–5.1)
Sodium: 137 meq/L (ref 135–145)
Total Bilirubin: 0.8 mg/dL (ref 0.2–1.2)
Total Protein: 7.1 g/dL (ref 6.0–8.3)

## 2019-06-16 LAB — LIPID PANEL
Cholesterol: 137 mg/dL (ref 0–200)
HDL: 52.9 mg/dL (ref >39.00)
LDL Cholesterol: 74 mg/dL (ref 0–99)
NonHDL: 84.23
Total CHOL/HDL Ratio: 3
Triglycerides: 53 mg/dL (ref 0.0–149.0)
VLDL: 10.6 mg/dL (ref 0.0–40.0)

## 2019-06-16 LAB — MICROALBUMIN / CREATININE URINE RATIO
Creatinine,U: 114.7 mg/dL
Microalb Creat Ratio: 0.6 mg/g (ref 0.0–30.0)
Microalb, Ur: 0.7 mg/dL (ref 0.0–1.9)

## 2019-06-16 LAB — HEMOGLOBIN A1C: Hgb A1c MFr Bld: 7.9 % — ABNORMAL HIGH (ref 4.6–6.5)

## 2019-06-16 MED ORDER — PRODIGY NO CODING BLOOD GLUC VI STRP: ORAL_STRIP | 11 refills | Status: DC

## 2019-06-16 MED ORDER — KETOCONAZOLE 2 % EX CREA: 1.0000 "application " | TOPICAL_CREAM | Freq: Every day | CUTANEOUS | 0 refills | Status: AC

## 2019-06-16 NOTE — Progress Notes (Signed)
Subjective:   Chief Complaint  Patient presents with  . Follow-up    diabetes    Jose Roberts is a 73 y.o. male here for follow-up of diabetes.   Jose Roberts's self monitored glucose range is low 100's.  Patient does require insulin- levemir 35 u/d   Medications include: Actos 30 mg/d Diet has been healthy. Exercise: walking 3x/week  Hypertension Patient presents for hypertension follow up. He does monitor home blood pressures. Blood pressures ranging on average from 130's/70-80's. He is compliant with medication- losartan 100 mg/d, Norvasc 10 mg/d, HCTZ 25 mg/d Patient has these side effects of medication: none He is adhering to a healthy diet overall. Exercise: walking  Hyperlipidemia Patient presents for dyslipidemia follow up. Currently being treated with pravastatin 40 mg/d and compliance with treatment thus far has been good. He denies myalgias. He is adhering to a healthy diet. Exercise: walking The patient is known to have coexisting coronary artery disease.   Past Medical History:  Diagnosis Date  . Blind   . Diabetes mellitus   . Fistula   . Fournier's gangrene - s/p I&D May 2012 x2 02/19/2011  . Hemorrhoids   . Hypertension      Related testing: Date of retinal exam: N/A, pt is blind Pneumovax: done Flu Shot: done  Review of Systems: Pulmonary:  No SOB Cardiovascular:  No chest pain  Objective:  BP 124/82 (BP Location: Left Arm, Patient Position: Sitting, Cuff Size: Large)   Pulse 78   Temp (!) 97.5 F (36.4 C) (Temporal)   Ht 5\' 11"  (1.803 m)   Wt 261 lb 2 oz (118.4 kg)   SpO2 96%   BMI 36.42 kg/m  General:  Well developed, well nourished, in no apparent distress Skin:  Warm, no pallor or diaphoresis Head:  Normocephalic, atraumatic Eyes:  Pupils equal and round, sclera anicteric without injection  Lungs:  CTAB, no access msc use Cardio:  RRR, no bruits, no LE edema Musculoskeletal:  Symmetrical muscle groups noted without atrophy or  deformity Neuro:  Sensation intact to pinprick on feet Psych: Age appropriate judgment and insight  Assessment:   Diabetes mellitus type 2 in obese ([place]) - Plan: glucose blood (PRODIGY NO CODING BLOOD GLUC) test strip, Comprehensive metabolic panel, Lipid panel, Hemoglobin A1c, Microalbumin / creatinine urine ratio, HM DIABETES FOOT EXAM  Essential hypertension - Plan: Comprehensive metabolic panel  Hyperlipidemia, unspecified hyperlipidemia type - Plan: Lipid panel  Tinea pedis of both feet - Plan: ketoconazole (NIZORAL) 2 % cream   Plan:   Orders as above. Counseled on diet and exercise. F/u in 6 mo. The patient voiced understanding and agreement to the plan.  [place], DO 06/16/19 10:27 AM

## 2019-06-16 NOTE — Patient Instructions (Signed)
Give [place] 2-3 business days to get the results of your labs back.   Keep the diet clean and stay active.  Have your wife take a look at your feet, between your toes, in 6 weeks to make sure things are better.  Let us know if you need anything.

## 2019-07-26 ENCOUNTER — Encounter: Payer: Self-pay | Admitting: Family Medicine

## 2019-07-27 MED ORDER — PRAVASTATIN SODIUM 40 MG PO TABS: 40.0000 mg | ORAL_TABLET | Freq: Every day | ORAL | 3 refills | Status: DC

## 2019-08-10 ENCOUNTER — Other Ambulatory Visit: Payer: Self-pay | Admitting: Family Medicine

## 2019-08-12 ENCOUNTER — Other Ambulatory Visit: Payer: Self-pay | Admitting: Family Medicine

## 2019-09-04 ENCOUNTER — Ambulatory Visit: Payer: Medicare Other | Attending: Internal Medicine

## 2019-09-04 DIAGNOSIS — Z23 Encounter for immunization: Secondary | ICD-10-CM | POA: Insufficient documentation

## 2019-09-04 NOTE — Progress Notes (Signed)
   Covid-19 Vaccination Clinic  Name:  ASAHEL RISDEN    MRN: [id number] DOB: June 01, 1946  09/04/2019  Mr. Bieler was observed post Covid-19 immunization for 15 minutes without incidence. He was provided with Vaccine Information Sheet and instruction to access the V-Safe system.   Mr. Kohlmann was instructed to call 911 with any severe reactions post vaccine: Marland Kitchen Difficulty breathing  . Swelling of your face and throat  . A fast heartbeat  . A bad rash all over your body  . Dizziness and weakness    Immunizations Administered    Name Date Dose VIS Date Route   Pfizer COVID-19 Vaccine 09/04/2019  5:49 PM 0.3 mL 07/24/2019 Intramuscular   Manufacturer: [employer], [employer]   Lot: [id number]   NDC: [address]-0

## 2019-09-22 ENCOUNTER — Other Ambulatory Visit: Payer: Self-pay

## 2019-09-22 ENCOUNTER — Emergency Department (HOSPITAL_BASED_OUTPATIENT_CLINIC_OR_DEPARTMENT_OTHER)
Admission: EM | Admit: 2019-09-22 | Discharge: 2019-09-22 | Disposition: A | Discharge status: Home / Self Care | Payer: Medicare Other | Attending: Emergency Medicine | Admitting: Emergency Medicine

## 2019-09-22 ENCOUNTER — Telehealth: Payer: Self-pay | Admitting: Nurse Practitioner

## 2019-09-22 ENCOUNTER — Emergency Department (HOSPITAL_BASED_OUTPATIENT_CLINIC_OR_DEPARTMENT_OTHER): Payer: Medicare Other

## 2019-09-22 ENCOUNTER — Encounter (HOSPITAL_BASED_OUTPATIENT_CLINIC_OR_DEPARTMENT_OTHER): Payer: Self-pay | Admitting: Emergency Medicine

## 2019-09-22 DIAGNOSIS — Z79899 Other long term (current) drug therapy: Secondary | ICD-10-CM | POA: Insufficient documentation

## 2019-09-22 DIAGNOSIS — E876 Hypokalemia: Secondary | ICD-10-CM | POA: Insufficient documentation

## 2019-09-22 DIAGNOSIS — U071 COVID-19: Secondary | ICD-10-CM | POA: Diagnosis not present

## 2019-09-22 DIAGNOSIS — I10 Essential (primary) hypertension: Secondary | ICD-10-CM | POA: Diagnosis not present

## 2019-09-22 DIAGNOSIS — E1165 Type 2 diabetes mellitus with hyperglycemia: Secondary | ICD-10-CM | POA: Insufficient documentation

## 2019-09-22 DIAGNOSIS — Z794 Long term (current) use of insulin: Secondary | ICD-10-CM | POA: Insufficient documentation

## 2019-09-22 DIAGNOSIS — J1282 Pneumonia due to coronavirus disease 2019: Secondary | ICD-10-CM

## 2019-09-22 DIAGNOSIS — R509 Fever, unspecified: Secondary | ICD-10-CM | POA: Diagnosis present

## 2019-09-22 DIAGNOSIS — J1289 Other viral pneumonia: Secondary | ICD-10-CM | POA: Insufficient documentation

## 2019-09-22 DIAGNOSIS — R739 Hyperglycemia, unspecified: Secondary | ICD-10-CM

## 2019-09-22 LAB — COMPREHENSIVE METABOLIC PANEL
ALT: 25 U/L (ref 0–44)
AST: 30 U/L (ref 15–41)
Albumin: 3 g/dL — ABNORMAL LOW (ref 3.5–5.0)
Alkaline Phosphatase: 64 U/L (ref 38–126)
Anion gap: 9 (ref 5–15)
BUN: 14 mg/dL (ref 8–23)
CO2: 22 mmol/L (ref 22–32)
Calcium: 9 mg/dL (ref 8.9–10.3)
Chloride: 99 mmol/L (ref 98–111)
Creatinine, Ser: 1.3 mg/dL — ABNORMAL HIGH (ref 0.61–1.24)
GFR calc Af Amer: >60 mL/min (ref >60)
GFR calc non Af Amer: 55 mL/min — ABNORMAL LOW (ref >60)
Glucose, Bld: 319 mg/dL — ABNORMAL HIGH (ref 70–99)
Potassium: 2.8 mmol/L — ABNORMAL LOW (ref 3.5–5.1)
Sodium: 130 mmol/L — ABNORMAL LOW (ref 135–145)
Total Bilirubin: 1 mg/dL (ref 0.3–1.2)
Total Protein: 7.3 g/dL (ref 6.5–8.1)

## 2019-09-22 LAB — URINALYSIS, ROUTINE W REFLEX MICROSCOPIC
Bilirubin Urine: NEGATIVE
Glucose, UA: 250 mg/dL — AB
Hgb urine dipstick: NEGATIVE
Ketones, ur: NEGATIVE mg/dL
Leukocytes,Ua: NEGATIVE
Nitrite: NEGATIVE
Protein, ur: NEGATIVE mg/dL
Specific Gravity, Urine: 1.01 (ref 1.005–1.030)
pH: 7 (ref 5.0–8.0)

## 2019-09-22 LAB — CBC WITH DIFFERENTIAL/PLATELET
Abs Immature Granulocytes: 0.05 10*3/uL (ref 0.00–0.07)
Basophils Absolute: 0 10*3/uL (ref 0.0–0.1)
Basophils Relative: 0 %
Eosinophils Absolute: 0.1 10*3/uL (ref 0.0–0.5)
Eosinophils Relative: 1 %
HCT: 42 % (ref 39.0–52.0)
Hemoglobin: 14 g/dL (ref 13.0–17.0)
Immature Granulocytes: 1 %
Lymphocytes Relative: 8 %
Lymphs Abs: 0.7 10*3/uL (ref 0.7–4.0)
MCH: 27.5 pg (ref 26.0–34.0)
MCHC: 33.3 g/dL (ref 30.0–36.0)
MCV: 82.4 fL (ref 80.0–100.0)
Monocytes Absolute: 0.7 10*3/uL (ref 0.1–1.0)
Monocytes Relative: 8 %
Neutro Abs: 7 10*3/uL (ref 1.7–7.7)
Neutrophils Relative %: 82 %
Platelets: 234 10*3/uL (ref 150–400)
RBC: 5.1 MIL/uL (ref 4.22–5.81)
RDW: 13.3 % (ref 11.5–15.5)
WBC: 8.6 10*3/uL (ref 4.0–10.5)
nRBC: 0 % (ref 0.0–0.2)

## 2019-09-22 LAB — CBG MONITORING, ED
Glucose-Capillary: 315 mg/dL — ABNORMAL HIGH (ref 70–99)
Glucose-Capillary: 204 mg/dL — ABNORMAL HIGH (ref 70–99)

## 2019-09-22 LAB — MAGNESIUM: Magnesium: 2.1 mg/dL (ref 1.7–2.4)

## 2019-09-22 MED ORDER — SODIUM CHLORIDE 0.9 % IV BOLUS (SEPSIS)
500.0000 mL | Freq: Once | INTRAVENOUS | Status: AC
  Administered 2019-09-22: 06:00:00 500 mL via INTRAVENOUS

## 2019-09-22 MED ORDER — POTASSIUM CHLORIDE CRYS ER 20 MEQ PO TBCR: 40.0000 meq | EXTENDED_RELEASE_TABLET | Freq: Every day | ORAL | 0 refills | Status: DC

## 2019-09-22 MED ORDER — POTASSIUM CHLORIDE CRYS ER 20 MEQ PO TBCR
40.0000 meq | EXTENDED_RELEASE_TABLET | Freq: Once | ORAL | Status: AC
  Administered 2019-09-22: 40 meq via ORAL
  Filled 2019-09-22: qty 2

## 2019-09-22 MED ORDER — INSULIN REGULAR HUMAN 100 UNIT/ML IJ SOLN
5.0000 [IU] | Freq: Once | INTRAMUSCULAR | Status: AC
  Administered 2019-09-22: 5 [IU] via INTRAVENOUS
  Filled 2019-09-22: qty 1

## 2019-09-22 MED FILL — Insulin Regular (Human) Inj 100 Unit/ML: INTRAMUSCULAR | Qty: 0.05 | Status: AC

## 2019-09-22 NOTE — Discharge Instructions (Addendum)
Your labs showed slightly elevated blood glucose today.  Please avoid sugary foods and drinks such as candy, sweet tea, soda, Gatorade.  I recommend avoiding foods high in carbohydrates such as potatoes, pasta, bread as much as possible.  Please check your blood sugar regularly and take all of your diabetes medications as prescribed.  Potassium level was also slightly low at 2.8.  We are sending you home with a prescription of potassium tablets to take for the next several days.  I recommend that you have your primary care physician recheck your potassium level in 1 to 2 weeks.  Fever is considered a temperature of 100.4 or higher.  If you have fever, you may take Tylenol 1000 mg every 6 hours as needed.  This medication is over-the-counter.  Fever is a normal way that your body fights infection.  It is normal to continue to have fever if you have COVID-19.  Your x-ray does show that you have pneumonia from the COVID-19 virus.  You do not need antibiotics for this.  I feel you can be discharged home but if you develop shortness of breath, please return to the emergency department.  I have called the [facility] infusion center to have you set up for antibody infusions as an outpatient.  Please quarantine for 14 days after the onset of symptoms.  You will need to be fever free without using Tylenol or Ibuprofen for 3 full days AND your symptoms will need to be significantly improving or resolved (other than the loss of taste and smell which can last up to 3 months) before you can come out of quarantine.  You should isolate from others that you live with as well.  Any close contacts that have seen you in the past 14 days before your symptoms have started will need to be notified and they will need to quarantine for 14 full days even if they have a negative COVID test.  COVID-19 is a viral illness and currently there are no specific outpatient treatments other than supportive measures such as alternating  Tylenol and Ibuprofen, rest, increased fluid intake, and antibody infusions (which have sent a referral for).  You do not need antibiotics.  If you develop chest pain, difficulty breathing, have blue lips or fingertips, begin vomiting and can not stop and can not hold down fluids, feel like you may pass out or you do pass out, have confusion, please return to the emergency department.    I also recommend that you obtain a pulse oximeter.  If your pulse ox is ever 89% or less and is staying there without improving, please return to the emergency department.

## 2019-09-22 NOTE — ED Notes (Signed)
Pt o2 sats dropped to 94%, HR elev to 108 with ambulation. Pt denies difficulty breathing, denies dizziness, no s/s of distress.

## 2019-09-22 NOTE — ED Triage Notes (Signed)
Pt states dx of COVID on 2/1 and is continuing to run low grade fever and has elevated blood sugar of 212 this morning.

## 2019-09-22 NOTE — ED Provider Notes (Signed)
TIME SEEN: 5:46 AM  CHIEF COMPLAINT: "I can't get my temperature below 99.1 at night"  HPI: Patient is a 74 year old male with history of obesity, hypertension, hyperlipidemia, insulin-dependent diabetes, blindness who presents to the emergency department with complaints of elevated temperature.  Also reports increased thirst recently.  States that on 09/11/2019 he developed cold-like symptoms with nasal congestion and dry cough.  States that he works at [employer] and multiple people there have had COVID-19.  He states someone came to his house on 09/14/2019 and tested him for Covid and his test came back positive.  Since that time he reports he has had temperatures that go up to 99.1 at night that he cannot seem to get down.  He has never had a temperature of 100.4 or higher.  He last took Tylenol at 4:30 AM.  He reports he has been taking Tylenol regularly.  He states that on 09/13/2018 when he did have an episode of dizziness where he felt like he was going to pass out.  States EMS came to the house to check him and he decided not to come to the hospital and they told him everything was reassuring.  No dizziness or near syncopal events since that time.  No syncope.  He denies any chest pain or shortness of breath.  States he does feel fatigued.  No nausea or vomiting.  Has had diarrhea.  States that yesterday he felt great and was able to take a walk outside.  Jeanne Ivan - wife - [phone] (updated cell #)  ROS: See HPI Constitutional: no fever  Eyes: no drainage  ENT: no runny nose   Cardiovascular:  no chest pain  Resp: no SOB  GI: no vomiting; + diarrhea GU: no dysuria Integumentary: no rash  Allergy: no hives  Musculoskeletal: no leg swelling  Neurological: no slurred speech ROS otherwise negative  PAST MEDICAL HISTORY/PAST SURGICAL HISTORY:  Past Medical History:  Diagnosis Date  . Blind   . Diabetes mellitus   . Fistula   . Fournier's gangrene - s/p I&D May 2012  x2 02/19/2011  . Hemorrhoids   . Hypertension     MEDICATIONS:  Prior to Admission medications   Medication Sig Start Date End Date Taking? Authorizing Provider  amLODipine (NORVASC) 10 MG tablet Take 1 tablet by mouth once daily 08/12/19   Shelda Pal, DO  aspirin EC 81 MG tablet Take 81 mg by mouth daily.    [provider]  fluticasone (FLONASE) 50 MCG/ACT nasal spray Place 2 sprays into both nostrils daily. 11/06/17   Shelda Pal, DO  glucose blood (PRODIGY NO CODING BLOOD GLUC) test strip USE ONCE DAILY TO CHECK  BLOOD SUGAR 3 times daily 06/16/19   Shelda Pal, DO  hydrochlorothiazide (HYDRODIURIL) 25 MG tablet Take 1 tablet by mouth once daily 08/12/19   Shelda Pal, DO  Insulin Pen Needle (PEN NEEDLES) 32G X 4 MM MISC Inject 35 Units as directed daily. To use with levemir 02/18/19   Shelda Pal, DO  LEVEMIR FLEXTOUCH 100 UNIT/ML Pen INJECT SUBCUTANEOUSLY 35  UNITS DAILY 05/25/19   Shelda Pal, DO  losartan (COZAAR) 100 MG tablet Take 1 tablet by mouth once daily 08/12/19   Shelda Pal, DO  Multiple Vitamin (MULTIVITAMIN WITH MINERALS) TABS Take 1 tablet by mouth daily.    [provider]  pioglitazone (ACTOS) 30 MG tablet TAKE ONE TABLET BY MOUTH EVERY DAY 08/10/19   Nani Ravens, Crosby Oyster,  DO  pravastatin (PRAVACHOL) 40 MG tablet Take 1 tablet (40 mg total) by mouth daily. 07/27/19   Sharlene Dory, DO  sildenafil (VIAGRA) 100 MG tablet TAKE ONE-HALF TO ONE TABLET BY MOUTH ONCE DAILY AS NEEDED FOR ERECTILE DYSFUNCTION 08/10/19   Sharlene Dory, DO    ALLERGIES:  No Known Allergies  SOCIAL HISTORY:  Social History   Tobacco Use  . Smoking status: Never Smoker  . Smokeless tobacco: Never Used  Substance Use Topics  . Alcohol use: Yes    Alcohol/week: 12.0 standard drinks    Types: 12 Cans of beer per week    Comment: per week    FAMILY HISTORY: Family  History  Problem Relation Age of Onset  . Diabetes Mother   . Hypertension Mother   . Cancer Mother        pancreas  . Diabetes Father   . Hypertension Father   . Diabetes Brother   . Diabetes Sister     EXAM: BP (!) 159/82   Pulse (!) 118   Temp 98.8 F (37.1 C) (Oral)   Resp 20   Ht 5\' 11"  (1.803 m)   Wt 120.2 kg   SpO2 97%   BMI 36.96 kg/m  CONSTITUTIONAL: Alert and oriented and responds appropriately to questions.  Elderly, blind.  Well-nourished and nontoxic-appearing.  Pleasant and in no distress.  Obese. HEAD: Normocephalic EYES: Conjunctivae clear, pupils appear equal, EOM appear intact ENT: normal nose; moist mucous membranes NECK: Supple, normal ROM CARD: Regular and tachycardic; S1 and S2 appreciated; no murmurs, no clicks, no rubs, no gallops RESP: Patient is mildly tachypneic.  Breath sounds clear and equal bilaterally; no wheezes, no rhonchi, no rales, no hypoxia or respiratory distress, speaking full sentences, his sat was 97% immediately after walking from the waiting room back to his room ABD/GI: Normal bowel sounds; non-distended; soft, non-tender, no rebound, no guarding, no peritoneal signs, no hepatosplenomegaly BACK:  The back appears normal EXT: Normal ROM in all joints; no deformity noted, no edema; no cyanosis SKIN: Normal color for age and race; warm; no rash on exposed skin NEURO: Moves all extremities equally PSYCH: The patient's mood and manner are appropriate.   MEDICAL DECISION MAKING: Patient here with complaints of elevated temperature at home but not true fever.  Also reports increased thirst and fatigue.  He is a diabetic.  Recently diagnosed with COVID-19 on 09/14/2019.  Differential includes anemia, electrolyte abnormality, UTI, DKA, Covid pneumonia.  Doubt ACS, PE, dissection.  He is well-appearing, nontoxic here.  Doubt sepsis.  No hypoxia.  ED PROGRESS: CBG is 315.  Will give 500 mL fluid bolus and 5 units of IV insulin.    Patient is not  in DKA.  Normal bicarb and normal anion gap.  Potassium level slightly low at 2.8.  Will give oral replacement and discharged with prescription of the same.  Magnesium level is normal.  Creatinine 1.3 which appears to be near his baseline.  He is receiving gentle IV hydration.   Attempted to get in touch with patient's wife, 11/12/2019, twice with no answer.   His chest x-ray does show signs of COVID-19 pneumonia but he is no longer tachycardic or tachypneic.  He has no increased work of breathing, hypoxia or respiratory distress.  He denies chest pain or shortness of breath.  At this time I do not feel he needs admission to the hospital but have had lengthy discussion with patient regarding these findings and strict return precautions  if symptoms worsen.   I feel patient may benefit from antibody infusion as an outpatient although he may be outside of window.  I have called the  infusion center and left a message for referral for this patient.   7:00 AM  Update patient's wife at [phone] (will update in chart).  Patient's wife reports that she is a Engineer, civil (consulting).  Have advised her to obtain a pulse oximeter at home and watch his pulse ox closely.  We will recheck it again before he leaves.  He has not had any hypoxia, increased work of breathing here.  He was mildly tachypneic initially but this has resolved.  He denies having any chest pain or shortness of breath still.  Discussed with her that this is a viral pneumonia and he does not need antibiotics.  Discussed this with patient as well.  He does not meet criteria for admission currently or IV remdesivir or Decadron.  I did discuss return precautions at length with both patient and wife.  Repeat blood sugar is in the 200s.  Urinalysis pending.  Signed out the oncoming ED physician.  Anticipate discharge home as long as there is no sign of increased work of breathing or hypoxia from his COVID-19 pneumonia.  Patient and wife verbalized  understanding.   I reviewed all nursing notes and pertinent previous records as available.  I have interpreted any EKGs, lab and urine results, imaging (as available).   EKG Interpretation  Date/Time:  Tuesday September 22 2019 05:54:49 EST Ventricular Rate:  101 PR Interval:    QRS Duration: 109 QT Interval:  437 QTC Calculation: 567 R Axis:   -46 Text Interpretation: Sinus tachycardia Ventricular premature complex Inferior infarct, old No significant change since last tracing Confirmed by Rochele Raring [phone]) on 09/22/2019 5:57:48 AM         Shelly Rubenstein was evaluated in Emergency Department on 09/22/2019 for the symptoms described in the history of present illness. He was evaluated in the context of the global COVID-19 pandemic, which necessitated consideration that the patient might be at risk for infection with the SARS-CoV-2 virus that causes COVID-19. Institutional protocols and algorithms that pertain to the evaluation of patients at risk for COVID-19 are in a state of rapid change based on information released by regulatory bodies including the CDC and federal and state organizations. These policies and algorithms were followed during the patient's care in the ED.  Patient was seen wearing N95, face shield, gloves, gown.    Kamarie Veno, Layla Maw, DO 09/22/19 [phone]

## 2019-09-22 NOTE — ED Notes (Signed)
Pt verbalized understanding d/c instructions. Pt has given verbal consent for understanding instructions, verbal consent as signature

## 2019-09-22 NOTE — ED Provider Notes (Signed)
Signout note  74 year old male past medical history obesity, hypertension, diabetes, blindness presented to ER with complaint of elevated temperature.  Around 10 days COVID-19 symptoms.  Noted to be mildly tachypneic, CXR with mild bilateral infiltrates consistent with Covid pneumonia.  7:00 AM Received signout from Dr. Elesa Massed pending UA, ambulation trial, if ambulation trial okay, can be discharged home  7:39 AM  Reviewed UA results, no UTI, patient performs well on ambulation trial, saturations as low as 94%, quickly rebounded to 96 to 98%.  Reassessed patient, noted mild tachypnea but no accessory muscle use, no distress, speaking in full sentences. Reviewed results with patient. Agree with Dr. Jolyn Nap assessment patient can be discharged and managed as outpatient at this time.  Reviewed return precautions with patient, additionally recommended following up with his primary doctor for close virtual recheck.   Milagros Loll, MD 09/22/19 [phone]

## 2019-09-22 NOTE — Telephone Encounter (Signed)
Called to Discuss with patient about Covid symptoms and the use of bamlanivimab, a monoclonal antibody infusion for those with mild to moderate Covid symptoms and at a high risk of hospitalization.     Per ED note symptoms started 09/11/19 and therefore patient does not meet criteria for infusion. Symptom onset more than 10 days ago.   Left patient a message explaining this.

## 2019-09-22 NOTE — ED Notes (Signed)
Patient denies pain and is resting comfortably.  

## 2019-09-23 ENCOUNTER — Ambulatory Visit (INDEPENDENT_AMBULATORY_CARE_PROVIDER_SITE_OTHER): Payer: Medicare Other | Admitting: Family Medicine

## 2019-09-23 ENCOUNTER — Encounter: Payer: Self-pay | Admitting: Family Medicine

## 2019-09-23 DIAGNOSIS — E876 Hypokalemia: Secondary | ICD-10-CM | POA: Diagnosis not present

## 2019-09-23 DIAGNOSIS — J189 Pneumonia, unspecified organism: Secondary | ICD-10-CM

## 2019-09-23 MED ORDER — DOXYCYCLINE HYCLATE 100 MG PO CAPS: 100.0000 mg | ORAL_CAPSULE | Freq: Two times a day (BID) | ORAL | 0 refills | Status: DC

## 2019-09-23 NOTE — Progress Notes (Signed)
[place] at [facility] [address] [place], [place] [address] [phone]  Date:  09/23/2019   Name:  Jose Roberts   DOB:  May 04, 1946   MRN:  [id number]  PCP:  Jose Pal, DO    Chief Complaint: No chief complaint on file.   History of Present Illness:  Jose Roberts is a 74 y.o. very pleasant male patient who presents with the following:  Virtual visit today with concern of recent COVID-16 diagnosis  Jose Roberts is a patient of my partner Dr. Nani Roberts, history of decently controlled insulin-dependent diabetes, hypertension, hyperlipidemia, blindness I have not seen this patient in the past  Lab Results  Component Value Date   HGBA1C 7.9 (H) 06/16/2019   Patient location is home, provider location is office Patient identity confirmed with 2 factors, he gives consent for virtual visit today The patient and myself are present on the phone call today, he reports his wife is asleep  He was seen in the ER yesterday with fever-he tested positive for COVID-19 on February 1.  Symptoms started on approximate January 29.  Yesterday in the ER he was given insulin and fluids, he was not in DKA.  His potassium was repleted orally and he was discharged home Per patient, he did not qualify for infusion therapy at this time  Chest x-ray as follows DG Chest Portable 1 View  Result Date: 09/22/2019 CLINICAL DATA:  COVID-19 diagnosis 09/14/2019, tachycardic and mildly to CT neck EXAM: PORTABLE CHEST 1 VIEW COMPARISON:  None FINDINGS: Patchy opacities present in the left mid to lower lung periphery, right lung base and right apex. No pneumothorax or effusion. The aorta is calcified. The remaining cardiomediastinal contours are unremarkable. No acute osseous or soft tissue abnormality. Telemetry leads overlie the chest. Metallic necklace overlies the base of the neck. IMPRESSION: Multifocal patchy opacities worrisome for pneumonia in the  setting of COVID-19 positivity. Electronically Signed   By: Jose Roberts M.D.   On: 09/22/2019 06:37   Today [name] notes that he is feeling somewhat better, he feels "pretty good" Temp this am was 98.1 Glucose this am was 169 He would like to be on abx for pneumonia which I think is reasonable   He did not check any other vitals this am-this is made more challenging because he is blind, needs to wait till his wife wakes up  Patient Active Problem List   Diagnosis Date Noted  . Hyperlipidemia 06/16/2019  . Essential hypertension 08/22/2018  . Diabetes mellitus type 2 in obese ([place]) 01/31/2018  . Erectile dysfunction 01/31/2018  . Insomnia 11/06/2017  . 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 Blind   . Diabetes mellitus   . Fistula   . Fournier's gangrene - s/p I&D May 2012 x2 02/19/2011  . Hemorrhoids   . Hypertension     Past Surgical History:  Procedure Laterality Date  . EYE SURGERY    . HEMORRHOID SURGERY     revision  . RETINAL DETACHMENT SURGERY    . surgery on gangrene  May 6/7/8, 2012 (x 3 debridements)   Fournier's Gangrene of perineum    Social History   Tobacco Use  . Smoking status: Never Smoker  . Smokeless tobacco: Never Used  Substance Use Topics  . Alcohol use: Yes    Alcohol/week: 12.0 standard drinks    Types: 12 Cans of beer per week  Comment: per week  . Drug use: No    Family History  Problem Relation Age of Onset  . Diabetes Mother   . Hypertension Mother   . Cancer Mother        pancreas  . Diabetes Father   . Hypertension Father   . Diabetes Brother   . Diabetes Sister     No Known Allergies  Medication list has been reviewed and updated.  Current Outpatient Medications on File Prior to Visit  Medication Sig Dispense Refill  . amLODipine (NORVASC) 10 MG tablet Take 1 tablet by mouth once daily 90 tablet 0  . aspirin EC 81 MG tablet Take 81 mg by mouth daily.    . fluticasone (FLONASE) 50 MCG/ACT  nasal spray Place 2 sprays into both nostrils daily. 16 g 6  . glucose blood (PRODIGY NO CODING BLOOD GLUC) test strip USE ONCE DAILY TO CHECK  BLOOD SUGAR 3 times daily 300 each 11  . hydrochlorothiazide (HYDRODIURIL) 25 MG tablet Take 1 tablet by mouth once daily 90 tablet 0  . Insulin Pen Needle (PEN NEEDLES) 32G X 4 MM MISC Inject 35 Units as directed daily. To use with levemir 90 each 0  . LEVEMIR FLEXTOUCH 100 UNIT/ML Pen INJECT SUBCUTANEOUSLY 35  UNITS DAILY 45 mL 3  . losartan (COZAAR) 100 MG tablet Take 1 tablet by mouth once daily 90 tablet 0  . Multiple Vitamin (MULTIVITAMIN WITH MINERALS) TABS Take 1 tablet by mouth daily.    . pioglitazone (ACTOS) 30 MG tablet TAKE ONE TABLET BY MOUTH EVERY DAY 90 tablet 0  . potassium chloride SA (KLOR-CON) 20 MEQ tablet Take 2 tablets (40 mEq total) by mouth daily. 10 tablet 0  . pravastatin (PRAVACHOL) 40 MG tablet Take 1 tablet (40 mg total) by mouth daily. 90 tablet 3  . sildenafil (VIAGRA) 100 MG tablet TAKE ONE-HALF TO ONE TABLET BY MOUTH ONCE DAILY AS NEEDED FOR ERECTILE DYSFUNCTION 10 tablet 2   No current facility-administered medications on file prior to visit.    Review of Systems:  As per HPI- otherwise negative.   Physical Examination: There were no vitals filed for this visit. There were no vitals filed for this visit. There is no height or weight on file to calculate BMI. Ideal Body Weight:    Spoke to pt on phone today He sounds well, no cough or SOB, distress noted   Assessment and Plan: Community acquired pneumonia, unspecified laterality - Plan: doxycycline (VIBRAMYCIN) 100 MG capsule  Hypokalemia - Plan: Basic metabolic panel  Patient with COVID-19, he was seen in the ER yesterday and noted to have pneumonia on chest film.  While this is likely viral, patient has had symptoms for about 2 weeks now requesting antibiotic.  I think this is reasonable, prescribe doxycycline and explained how to use  medication Hypokalemia yesterday, normally his potassium is okay.  He does take HCTZ Encouraged him to take the potassium Rx he was given, and then come in for a repeat potassium level in the next 1 to 2 weeks at his convenience.  Order this for him today He is asked to keep myself or Dr. Carmelia Roller closely updated as to his condition, especially if he is getting worse  Spoke to patient for 10 minutes on the telephone Moderate medical decision making  Signed Abbe Amsterdam, MD

## 2019-09-25 ENCOUNTER — Ambulatory Visit: Payer: Medicare Other

## 2019-09-30 ENCOUNTER — Other Ambulatory Visit: Payer: Medicare Other

## 2019-10-02 ENCOUNTER — Telehealth: Payer: Self-pay | Admitting: Family Medicine

## 2019-10-02 ENCOUNTER — Ambulatory Visit: Payer: Self-pay

## 2019-10-02 NOTE — Telephone Encounter (Signed)
Try Melatonin, sleep/meditation apps, white noise. If that does not work, might need to be seen sooner. Ty.

## 2019-10-02 NOTE — Telephone Encounter (Signed)
1- keep an eye on it as he gets better. sched appt if getting worse. 2- Yes, as long as he is not having any more symptoms. 3- His judgment, we are happy to see him. Needs to follow up for his lab visit next week though.

## 2019-10-02 NOTE — Telephone Encounter (Signed)
Have spoken to the patient already today regarding this message.(see other telephone note for today)

## 2019-10-02 NOTE — Telephone Encounter (Signed)
Caller Name: Sehaj Phone: [phone]   1. Pt states that he was diagnosed COVID+ 09/14/2019 and blood sugar this morning was 212 fasting.   2. 09/04/2019 pt has the 1st dose of the COVID-19 vaccine. When should he get the 2nd dose or does he have to start over?  3. 09/23/2019 Dr. Patsy Lager prescribed ABX for pneumonia. He took the last dose 10/01/2019. Pt isn't sure about needing to f/u on pneumonia. Pt is still fatigued but having trouble sleeping.

## 2019-10-02 NOTE — Telephone Encounter (Signed)
Called informed of PCP instructions. Scheduled f/u appt with PCP on 10/14/19 Patient has not slept well since having COVID, wake after sleeping a couple hours and cannot get back to sleep?  Advise please Call back on [phone]

## 2019-10-02 NOTE — Telephone Encounter (Signed)
Called informed of PCP instructions. He will discuss with PCP at upcoming appt

## 2019-10-02 NOTE — Telephone Encounter (Signed)
Incoming call from Patient  Inquiring how many days  From completinging isolation period can he have the his second vaccination.  Related to Patient that he would have to wait 30 days. Patient states that that would make him over due.  Then related to Patient that he would have to start series over.  Voiced understanding.

## 2019-10-07 ENCOUNTER — Other Ambulatory Visit: Payer: Medicare Other

## 2019-10-13 ENCOUNTER — Telehealth: Payer: Self-pay | Admitting: *Deleted

## 2019-10-13 ENCOUNTER — Other Ambulatory Visit: Payer: Self-pay

## 2019-10-13 NOTE — Telephone Encounter (Signed)
Opened in error

## 2019-10-13 NOTE — Addendum Note (Signed)
Addended by: Harley Alto on: 10/13/2019 03:01 PM   Modules accepted: Orders

## 2019-10-14 ENCOUNTER — Encounter: Payer: Self-pay | Admitting: Family Medicine

## 2019-10-14 ENCOUNTER — Ambulatory Visit: Payer: Medicare Other | Attending: Internal Medicine

## 2019-10-14 ENCOUNTER — Other Ambulatory Visit (INDEPENDENT_AMBULATORY_CARE_PROVIDER_SITE_OTHER): Payer: Medicare Other

## 2019-10-14 ENCOUNTER — Encounter: Payer: Medicare Other | Admitting: Family Medicine

## 2019-10-14 ENCOUNTER — Other Ambulatory Visit: Payer: Self-pay

## 2019-10-14 DIAGNOSIS — Z23 Encounter for immunization: Secondary | ICD-10-CM

## 2019-10-14 DIAGNOSIS — E876 Hypokalemia: Secondary | ICD-10-CM

## 2019-10-14 LAB — BASIC METABOLIC PANEL
BUN: 13 mg/dL (ref 6–23)
CO2: 32 mEq/L (ref 19–32)
Calcium: 10.2 mg/dL (ref 8.4–10.5)
Chloride: 100 mEq/L (ref 96–112)
Creatinine, Ser: 1.06 mg/dL (ref 0.40–1.50)
GFR: 83.03 mL/min (ref >60.00)
Glucose, Bld: 161 mg/dL — ABNORMAL HIGH (ref 70–99)
Potassium: 3.8 mEq/L (ref 3.5–5.1)
Sodium: 138 mEq/L (ref 135–145)

## 2019-10-14 NOTE — Progress Notes (Signed)
Appt made in error

## 2019-10-14 NOTE — Progress Notes (Signed)
   Covid-19 Vaccination Clinic  Name:  Jose Roberts    MRN: [id number] DOB: 1946-05-24  10/14/2019  [name] was observed post Covid-19 immunization for 15 minutes without incident. He was provided with Vaccine Information Sheet and instruction to access the V-Safe system.   [name] was instructed to call 911 with any severe reactions post vaccine: Marland Kitchen Difficulty breathing  . Swelling of face and throat  . A fast heartbeat  . A bad rash all over body  . Dizziness and weakness   Immunizations Administered    Name Date Dose VIS Date Route   Pfizer COVID-19 Vaccine 10/14/2019  4:05 PM 0.3 mL 07/24/2019 Intramuscular   Manufacturer: [employer], [employer]   Lot: [id number]   NDC: [address]-1

## 2019-11-20 ENCOUNTER — Other Ambulatory Visit: Payer: Self-pay | Admitting: Family Medicine

## 2019-12-12 ENCOUNTER — Other Ambulatory Visit: Payer: Self-pay | Admitting: Family Medicine

## 2019-12-30 ENCOUNTER — Encounter: Payer: Medicare Other | Admitting: Family Medicine

## 2020-01-12 ENCOUNTER — Emergency Department (HOSPITAL_COMMUNITY)
Admission: EM | Admit: 2020-01-12 | Discharge: 2020-01-13 | Disposition: A | Discharge status: Home / Self Care | Payer: Medicare Other | Attending: Emergency Medicine | Admitting: Emergency Medicine

## 2020-01-12 ENCOUNTER — Encounter (HOSPITAL_COMMUNITY): Payer: Self-pay

## 2020-01-12 ENCOUNTER — Other Ambulatory Visit: Payer: Self-pay

## 2020-01-12 DIAGNOSIS — Z7982 Long term (current) use of aspirin: Secondary | ICD-10-CM | POA: Diagnosis not present

## 2020-01-12 DIAGNOSIS — Z79899 Other long term (current) drug therapy: Secondary | ICD-10-CM | POA: Insufficient documentation

## 2020-01-12 DIAGNOSIS — L0231 Cutaneous abscess of buttock: Secondary | ICD-10-CM | POA: Insufficient documentation

## 2020-01-12 DIAGNOSIS — Z794 Long term (current) use of insulin: Secondary | ICD-10-CM | POA: Insufficient documentation

## 2020-01-12 DIAGNOSIS — I10 Essential (primary) hypertension: Secondary | ICD-10-CM | POA: Insufficient documentation

## 2020-01-12 DIAGNOSIS — E119 Type 2 diabetes mellitus without complications: Secondary | ICD-10-CM | POA: Insufficient documentation

## 2020-01-12 LAB — CBC WITH DIFFERENTIAL/PLATELET
Abs Immature Granulocytes: 0.03 10*3/uL (ref 0.00–0.07)
Basophils Absolute: 0 10*3/uL (ref 0.0–0.1)
Basophils Relative: 0 %
Eosinophils Absolute: 0.1 10*3/uL (ref 0.0–0.5)
Eosinophils Relative: 1 %
HCT: 41.8 % (ref 39.0–52.0)
Hemoglobin: 13.7 g/dL (ref 13.0–17.0)
Immature Granulocytes: 0 %
Lymphocytes Relative: 15 %
Lymphs Abs: 1.7 10*3/uL (ref 0.7–4.0)
MCH: 28 pg (ref 26.0–34.0)
MCHC: 32.8 g/dL (ref 30.0–36.0)
MCV: 85.5 fL (ref 80.0–100.0)
Monocytes Absolute: 1.1 10*3/uL — ABNORMAL HIGH (ref 0.1–1.0)
Monocytes Relative: 10 %
Neutro Abs: 8.1 10*3/uL — ABNORMAL HIGH (ref 1.7–7.7)
Neutrophils Relative %: 74 %
Platelets: 224 10*3/uL (ref 150–400)
RBC: 4.89 MIL/uL (ref 4.22–5.81)
RDW: 14.1 % (ref 11.5–15.5)
WBC: 10.9 10*3/uL — ABNORMAL HIGH (ref 4.0–10.5)
nRBC: 0 % (ref 0.0–0.2)

## 2020-01-12 LAB — I-STAT CHEM 8, ED
BUN: 18 mg/dL (ref 8–23)
Calcium, Ion: 1.27 mmol/L (ref 1.15–1.40)
Chloride: 101 mmol/L (ref 98–111)
Creatinine, Ser: 1.1 mg/dL (ref 0.61–1.24)
Glucose, Bld: 214 mg/dL — ABNORMAL HIGH (ref 70–99)
HCT: 43 % (ref 39.0–52.0)
Hemoglobin: 14.6 g/dL (ref 13.0–17.0)
Potassium: 3.3 mmol/L — ABNORMAL LOW (ref 3.5–5.1)
Sodium: 140 mmol/L (ref 135–145)
TCO2: 28 mmol/L (ref 22–32)

## 2020-01-12 NOTE — ED Triage Notes (Signed)
Patient arrived stating he has a abscess on his rectum since Sunday. Reports having this in the past and advised to not wait too long. Patient reports trying preparation H with no relief.

## 2020-01-13 ENCOUNTER — Encounter (HOSPITAL_COMMUNITY): Payer: Self-pay | Admitting: Emergency Medicine

## 2020-01-13 ENCOUNTER — Emergency Department (HOSPITAL_COMMUNITY): Payer: Medicare Other

## 2020-01-13 MED ORDER — DOXYCYCLINE HYCLATE 100 MG PO CAPS: 100.0000 mg | ORAL_CAPSULE | Freq: Two times a day (BID) | ORAL | 0 refills | Status: DC

## 2020-01-13 MED ORDER — IOHEXOL 300 MG/ML  SOLN
100.0000 mL | Freq: Once | INTRAMUSCULAR | Status: AC | PRN
  Administered 2020-01-13: 100 mL via INTRAVENOUS

## 2020-01-13 MED ORDER — DOXYCYCLINE HYCLATE 100 MG PO TABS
100.0000 mg | ORAL_TABLET | Freq: Once | ORAL | Status: AC
  Administered 2020-01-13: 100 mg via ORAL
  Filled 2020-01-13: qty 1

## 2020-01-13 MED ORDER — LIDOCAINE HCL 2 % IJ SOLN
20.0000 mL | Freq: Once | INTRAMUSCULAR | Status: DC
  Filled 2020-01-13: qty 20

## 2020-01-13 NOTE — ED Provider Notes (Signed)
[facility]-EMERGENCY DEPT Provider Note   CSN: [id number] Arrival date & time: 01/12/20  1818     History Chief Complaint  Patient presents with  . Abscess    Jose Roberts is a 74 y.o. male.  The history is provided by the patient.  Abscess Location:  Pelvis Pelvic abscess location:  R buttock Abscess quality: induration and painful   Red streaking: no   Duration:  4 days Progression:  Worsening Pain details:    Quality:  Dull   Severity:  Moderate   Duration:  4 days   Timing:  Constant   Progression:  Worsening Chronicity:  Recurrent Context: diabetes   Relieved by:  Nothing Worsened by:  Nothing Ineffective treatments:  None tried Associated symptoms: no anorexia and no fever   Risk factors: no family hx of MRSA        Past Medical History:  Diagnosis Date  . Blind   . Diabetes mellitus   . Fistula   . Fournier's gangrene - s/p I&D May 2012 x2 02/19/2011  . Hemorrhoids   . Hypertension     Patient Active Problem List   Diagnosis Date Noted  . Hyperlipidemia 06/16/2019  . Essential hypertension 08/22/2018  . Diabetes mellitus type 2 in obese (HCC) 01/31/2018  . Erectile dysfunction 01/31/2018  . Insomnia 11/06/2017  . 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 EYE SURGERY    . HEMORRHOID SURGERY     revision  . RETINAL DETACHMENT SURGERY    . surgery on gangrene  May 6/7/8, 2012 (x 3 debridements)   Fournier's Gangrene of perineum       Family History  Problem Relation Age of Onset  . Diabetes Mother   . Hypertension Mother   . Cancer Mother        pancreas  . Diabetes Father   . Hypertension Father   . Diabetes Brother   . Diabetes Sister     Social History   Tobacco Use  . Smoking status: Never Smoker  . Smokeless tobacco: Never Used  Substance Use Topics  . Alcohol use: Yes    Alcohol/week: 12.0 standard drinks    Types: 12 Cans of beer per week    Comment: per week    . Drug use: No    Home Medications Prior to Admission medications   Medication Sig Start Date End Date Taking? Authorizing Provider  amLODipine (NORVASC) 10 MG tablet Take 1 tablet by mouth once daily 11/21/19   Sharlene Dory, DO  aspirin EC 81 MG tablet Take 81 mg by mouth daily.    [provider]  doxycycline (VIBRAMYCIN) 100 MG capsule Take 1 capsule (100 mg total) by mouth 2 (two) times daily. 09/23/19   Copland, Gwenlyn Found, MD  doxycycline (VIBRAMYCIN) 100 MG capsule Take 1 capsule (100 mg total) by mouth 2 (two) times daily. One po bid x 7 days 01/13/20   Tramond Slinker, MD  fluticasone [facility]) 50 MCG/ACT nasal spray Place 2 sprays into both nostrils daily. 11/06/17   Sharlene Dory, DO  glucose blood (PRODIGY NO CODING BLOOD GLUC) test strip USE ONCE DAILY TO CHECK  BLOOD SUGAR 3 times daily 06/16/19   Sharlene Dory, DO  hydrochlorothiazide (HYDRODIURIL) 25 MG tablet Take 1 tablet by mouth once daily 11/21/19   Wendling, Jilda Roche, DO  Insulin Pen Needle (PEN NEEDLES) 32G X 4 MM MISC Inject 35 Units as directed daily.  To use with levemir 02/18/19   Sharlene Dory, DO  LEVEMIR FLEXTOUCH 100 UNIT/ML Pen INJECT SUBCUTANEOUSLY 35  UNITS DAILY 05/25/19   Sharlene Dory, DO  losartan (COZAAR) 100 MG tablet Take 1 tablet by mouth once daily 11/21/19   Sharlene Dory, DO  Multiple Vitamin (MULTIVITAMIN WITH MINERALS) TABS Take 1 tablet by mouth daily.    [provider]  pioglitazone (ACTOS) 30 MG tablet TAKE ONE TABLET BY MOUTH EVERY DAY 12/14/19   Wendling, Jilda Roche, DO  potassium chloride SA (KLOR-CON) 20 MEQ tablet Take 2 tablets (40 mEq total) by mouth daily. 09/22/19   Ward, Layla Maw, DO  pravastatin (PRAVACHOL) 40 MG tablet Take 1 tablet (40 mg total) by mouth daily. 07/27/19   Sharlene Dory, DO  sildenafil (VIAGRA) 100 MG tablet TAKE ONE-HALF TO ONE TABLET BY MOUTH ONCE DAILY AS NEEDED FOR ERECTILE  DYSFUNCTION 12/14/19   Sharlene Dory, DO    Allergies    Patient has no known allergies.  Review of Systems   Review of Systems  Constitutional: Negative for fever.  HENT: Negative for congestion.   Eyes: Negative for visual disturbance.  Respiratory: Negative for shortness of breath.   Cardiovascular: Negative for chest pain.  Gastrointestinal: Negative for abdominal pain and anorexia.  Genitourinary: Negative for difficulty urinating.  Musculoskeletal: Negative for arthralgias.  Skin: Negative for wound.  Neurological: Negative for dizziness.  Psychiatric/Behavioral: Negative for agitation.  All other systems reviewed and are negative.   Physical Exam Updated Vital Signs BP (!) 143/68   Pulse 88   Temp 98.1 F (36.7 C) (Oral)   Resp 16   SpO2 100%   Physical Exam Vitals and nursing note reviewed.  Constitutional:      General: He is not in acute distress.    Appearance: Normal appearance.  HENT:     Head: Normocephalic and atraumatic.     Nose: Nose normal.  Eyes:     Conjunctiva/sclera: Conjunctivae normal.     Pupils: Pupils are equal, round, and reactive to light.  Cardiovascular:     Rate and Rhythm: Normal rate and regular rhythm.     Pulses: Normal pulses.     Heart sounds: Normal heart sounds.  Pulmonary:     Effort: Pulmonary effort is normal.     Breath sounds: Normal breath sounds.  Abdominal:     General: Abdomen is flat. Bowel sounds are normal.     Palpations: Abdomen is soft.     Tenderness: There is no abdominal tenderness. There is no guarding.  Musculoskeletal:        General: Normal range of motion.     Cervical back: Normal range of motion and neck supple.  Skin:    General: Skin is warm and dry.     Capillary Refill: Capillary refill takes less than 2 seconds.       Neurological:     General: No focal deficit present.     Mental Status: He is alert and oriented to person, place, and time.     Deep Tendon Reflexes: Reflexes  normal.  Psychiatric:        Mood and Affect: Mood normal.        Behavior: Behavior normal.     ED Results / Procedures / Treatments   Labs (all labs ordered are listed, but only abnormal results are displayed) Labs Reviewed  CBC WITH DIFFERENTIAL/PLATELET - Abnormal; Notable for the following components:  Result Value   WBC 10.9 (*)    Neutro Abs 8.1 (*)    Monocytes Absolute 1.1 (*)    All other components within normal limits  I-STAT CHEM 8, ED - Abnormal; Notable for the following components:   Potassium 3.3 (*)    Glucose, Bld 214 (*)    All other components within normal limits    EKG None  Radiology CT ABDOMEN PELVIS W CONTRAST  Result Date: 01/13/2020 CLINICAL DATA:  Abscess on rectum pain EXAM: CT ABDOMEN AND PELVIS WITH CONTRAST TECHNIQUE: Multidetector CT imaging of the abdomen and pelvis was performed using the standard protocol following bolus administration of intravenous contrast. CONTRAST:  OMNIPAQUE IOHEXOL 300 MG/ML  SOLN COMPARISON:  None. FINDINGS: Lower chest: The visualized heart size within normal limits. No pericardial fluid/thickening. No hiatal hernia. The visualized portions of the lungs are clear. Hepatobiliary: The liver is normal in density without focal abnormality.The main portal vein is patent. No evidence of calcified gallstones, gallbladder wall thickening or biliary dilatation. Pancreas: Unremarkable. No pancreatic ductal dilatation or surrounding inflammatory changes. Spleen: Normal in size without focal abnormality. Adrenals/Urinary Tract: Both adrenal glands appear normal. The kidneys and collecting system appear normal without evidence of urinary tract calculus or hydronephrosis. Bladder is unremarkable. Stomach/Bowel: The stomach and small bowel are normal in appearance. There is a moderate amount of colonic stool. The appendix is unremarkable. Within the right gluteal fold/perineal soft tissues there is a soft tissue stranding and a  loculated fluid collection extending around the posterior right perirectal soft tissues measuring approximately 2.0 x 1.2 x 3.5 cm. There is soft tissue stranding seen in the bilateral perineal soft tissues. No subcutaneous emphysema is noted. Vascular/Lymphatic: There are no enlarged mesenteric, retroperitoneal, or pelvic lymph nodes. Scattered aortic atherosclerotic calcifications are seen without aneurysmal dilatation. Reproductive: The prostate is unremarkable. Other: No evidence of abdominal wall mass or hernia. Musculoskeletal: No acute or significant osseous findings. IMPRESSION: Right posterior gluteal fold/perirectal soft tissue abscess measuring 2.0 x 1.2 x 3.5 cm with surrounding perirectal edema and soft tissue stranding. No subcutaneous emphysema. Aortic Atherosclerosis (ICD10-I70.0). Electronically Signed   By: Jonna Clark M.D.   On: 01/13/2020 00:44    Procedures .Marland KitchenIncision and Drainage  Date/Time: 01/13/2020 2:37 AM Performed by: Cy Blamer, MD Authorized by: Cy Blamer, MD   Consent:    Consent obtained:  Verbal   Consent given by:  Patient   Risks discussed:  Bleeding, damage to other organs, incomplete drainage, infection and pain   Alternatives discussed:  No treatment Location:    Type:  Abscess   Size:  2   Location:  Anogenital   Anogenital location:  Gluteal cleft Pre-procedure details:    Skin preparation:  Betadine and Chloraprep Anesthesia (see MAR for exact dosages):    Anesthesia method:  Local infiltration   Local anesthetic:  Lidocaine 2% WITH epi Procedure type:    Complexity:  Complex Procedure details:    Needle aspiration: no     Incision types:  Elliptical   Incision depth:  Subcutaneous   Scalpel blade:  11   Wound management:  Probed and deloculated   Drainage:  Purulent   Drainage amount:  Scant   Wound treatment:  Wound left open   Packing materials:  None Post-procedure details:    Patient tolerance of procedure:  Tolerated well,  no immediate complications   (including critical care time)  Medications Ordered in ED Medications  lidocaine (XYLOCAINE) 2 % (with pres) injection  400 mg (has no administration in time range)  iohexol (OMNIPAQUE) 300 MG/ML solution 100 mL (100 mLs Intravenous Contrast Given 01/13/20 0019)  doxycycline (VIBRA-TABS) tablet 100 mg (100 mg Oral Given 01/13/20 0130)    ED Course  I have reviewed the triage vital signs and the nursing notes.  Pertinent labs & imaging results that were available during my care of the patient were reviewed by me and considered in my medical decision making (see chart for details).   Given diabetes history will start antibiotics.  Small abscess.  No signs or symptoms of necrotizing tissue infection.  Wound check in 48 hours.  Strict return precautions.    JAKE FUHRMANN was evaluated in Emergency Department on 01/13/2020 for the symptoms described in the history of present illness. He was evaluated in the context of the global COVID-19 pandemic, which necessitated consideration that the patient might be at risk for infection with the SARS-CoV-2 virus that causes COVID-19. Institutional protocols and algorithms that pertain to the evaluation of patients at risk for COVID-19 are in a state of rapid change based on information released by regulatory bodies including the CDC and federal and state organizations. These policies and algorithms were followed during the patient's care in the ED.   Final Clinical Impression(s) / ED Diagnoses Final diagnoses:  Abscess of buttock, right   Return for intractable cough, coughing up blood,fevers >100.4 unrelieved by medication, shortness of breath, intractable vomiting, chest pain, shortness of breath, weakness,numbness, changes in speech, facial asymmetry,abdominal pain, passing out,Inability to tolerate liquids or food, cough, altered mental status or any concerns. No signs of systemic illness or infection. The patient is  nontoxic-appearing on exam and vital signs are within normal limits.   I have reviewed the triage vital signs and the nursing notes. Pertinent labs &imaging results that were available during my care of the patient were reviewed by me and considered in my medical decision making (see chart for details).After history, exam, and medical workup I feel the patient has beenappropriately medically screened and is safe for discharge home. Pertinent diagnoses were discussed with the patient. Patient was given return precautions. Rx / DC Orders ED Discharge Orders         Ordered    doxycycline (VIBRAMYCIN) 100 MG capsule  2 times daily     01/13/20 0128           Henslee Lottman, MD 01/13/20 [id number]

## 2020-02-03 ENCOUNTER — Ambulatory Visit (INDEPENDENT_AMBULATORY_CARE_PROVIDER_SITE_OTHER): Payer: Medicare Other | Admitting: Family Medicine

## 2020-02-03 ENCOUNTER — Other Ambulatory Visit: Payer: Self-pay

## 2020-02-03 ENCOUNTER — Other Ambulatory Visit: Payer: Self-pay | Admitting: Family Medicine

## 2020-02-03 ENCOUNTER — Encounter: Payer: Self-pay | Admitting: Family Medicine

## 2020-02-03 VITALS — BP 138/80 | HR 85 | Temp 96.9°F | Ht 71.0 in | Wt 266.4 lb

## 2020-02-03 DIAGNOSIS — N529 Male erectile dysfunction, unspecified: Secondary | ICD-10-CM | POA: Diagnosis not present

## 2020-02-03 DIAGNOSIS — Z0000 Encounter for general adult medical examination without abnormal findings: Secondary | ICD-10-CM

## 2020-02-03 LAB — COMPREHENSIVE METABOLIC PANEL
ALT: 14 U/L (ref 0–53)
AST: 15 U/L (ref 0–37)
Albumin: 4.1 g/dL (ref 3.5–5.2)
Alkaline Phosphatase: 58 U/L (ref 39–117)
BUN: 19 mg/dL (ref 6–23)
CO2: 30 mEq/L (ref 19–32)
Calcium: 10 mg/dL (ref 8.4–10.5)
Chloride: 102 mEq/L (ref 96–112)
Creatinine, Ser: 1.18 mg/dL (ref 0.40–1.50)
GFR: 73.3 mL/min (ref >60.00)
Glucose, Bld: 148 mg/dL — ABNORMAL HIGH (ref 70–99)
Potassium: 3.7 mEq/L (ref 3.5–5.1)
Sodium: 137 mEq/L (ref 135–145)
Total Bilirubin: 0.7 mg/dL (ref 0.2–1.2)
Total Protein: 6.9 g/dL (ref 6.0–8.3)

## 2020-02-03 LAB — HEMOGLOBIN A1C: Hgb A1c MFr Bld: 8.1 % — ABNORMAL HIGH (ref 4.6–6.5)

## 2020-02-03 LAB — LIPID PANEL
Cholesterol: 154 mg/dL (ref 0–200)
HDL: 58.1 mg/dL (ref >39.00)
LDL Cholesterol: 85 mg/dL (ref 0–99)
NonHDL: 95.49
Total CHOL/HDL Ratio: 3
Triglycerides: 50 mg/dL (ref 0.0–149.0)
VLDL: 10 mg/dL (ref 0.0–40.0)

## 2020-02-03 LAB — CBC
HCT: 41.6 % (ref 39.0–52.0)
Hemoglobin: 13.7 g/dL (ref 13.0–17.0)
MCHC: 32.9 g/dL (ref 30.0–36.0)
MCV: 85.4 fl (ref 78.0–100.0)
Platelets: 236 10*3/uL (ref 150.0–400.0)
RBC: 4.88 Mil/uL (ref 4.22–5.81)
RDW: 14.6 % (ref 11.5–15.5)
WBC: 5.5 10*3/uL (ref 4.0–10.5)

## 2020-02-03 LAB — TESTOSTERONE: Testosterone: 390.42 ng/dL (ref 300.00–890.00)

## 2020-02-03 MED ORDER — LOSARTAN POTASSIUM 100 MG PO TABS: 100.0000 mg | ORAL_TABLET | Freq: Every day | ORAL | 2 refills | Status: DC

## 2020-02-03 MED ORDER — LEVEMIR FLEXTOUCH 100 UNIT/ML ~~LOC~~ SOPN: 40.0000 [IU] | PEN_INJECTOR | Freq: Every day | SUBCUTANEOUS | 3 refills | Status: DC

## 2020-02-03 MED ORDER — HYDROCHLOROTHIAZIDE 25 MG PO TABS: 25.0000 mg | ORAL_TABLET | Freq: Every day | ORAL | 2 refills | Status: DC

## 2020-02-03 MED ORDER — PIOGLITAZONE HCL 30 MG PO TABS: 30.0000 mg | ORAL_TABLET | Freq: Every day | ORAL | 2 refills | Status: DC

## 2020-02-03 MED ORDER — AMLODIPINE BESYLATE 10 MG PO TABS: 10.0000 mg | ORAL_TABLET | Freq: Every day | ORAL | 2 refills | Status: DC

## 2020-02-03 NOTE — Patient Instructions (Addendum)
Keep the diet clean and stay active.  Give [place] 2-3 business days to get the results of your labs back.   Sleep is important to [place] all. Getting good sleep is imperative to adequate functioning during the day. Work with our counselors who are trained to help people obtain quality sleep. Call [phone] to schedule an appointment or if you are curious about insurance coverage/cost.  Let us know if you need anything.

## 2020-02-03 NOTE — Addendum Note (Signed)
Addended by: Scharlene Gloss B on: 02/03/2020 08:39 AM   Modules accepted: Orders

## 2020-02-03 NOTE — Progress Notes (Signed)
Chief Complaint  Patient presents with  . Annual Exam    Well Male Jose Roberts is here for a complete physical.   His last physical was >1 year ago.  Current diet: in general, a "healthy" diet.   Current exercise: walking Weight trend: stable Fatigue? No. Seat belt? Yes.    Health maintenance Shingrix- No Colonoscopy- Yes Tetanus- Yes Hep C- Yes Pneumonia vaccine- Yes  Past Medical History:  Diagnosis Date  . Blind   . Diabetes mellitus   . Fistula   . Fournier's gangrene - s/p I&D May 2012 x2 02/19/2011  . Hemorrhoids   . Hypertension      Past Surgical History:  Procedure Laterality Date  . EYE SURGERY    . HEMORRHOID SURGERY     revision  . RETINAL DETACHMENT SURGERY    . surgery on gangrene  May 6/7/8, 2012 (x 3 debridements)   Fournier's Gangrene of perineum    Medications  Current Outpatient Medications on File Prior to Visit  Medication Sig Dispense Refill  . aspirin EC 81 MG tablet Take 81 mg by mouth daily.    . fluticasone (FLONASE) 50 MCG/ACT nasal spray Place 2 sprays into both nostrils daily. 16 g 6  . glucose blood (PRODIGY NO CODING BLOOD GLUC) test strip USE ONCE DAILY TO CHECK  BLOOD SUGAR 3 times daily 300 each 11  . Insulin Pen Needle (PEN NEEDLES) 32G X 4 MM MISC Inject 35 Units as directed daily. To use with levemir 90 each 0  . LEVEMIR FLEXTOUCH 100 UNIT/ML Pen INJECT SUBCUTANEOUSLY 35  UNITS DAILY 45 mL 3  . Multiple Vitamin (MULTIVITAMIN WITH MINERALS) TABS Take 1 tablet by mouth daily.    . pravastatin (PRAVACHOL) 40 MG tablet Take 1 tablet (40 mg total) by mouth daily. 90 tablet 3  . sildenafil (VIAGRA) 100 MG tablet TAKE ONE-HALF TO ONE TABLET BY MOUTH ONCE DAILY AS NEEDED FOR ERECTILE DYSFUNCTION 10 tablet 2   Allergies No Known Allergies  Family History Family History  Problem Relation Age of Onset  . Diabetes Mother   . Hypertension Mother   . Cancer Mother        pancreas  . Diabetes Father   . Hypertension Father   .  Diabetes Brother   . Diabetes Sister     Review of Systems: Constitutional:  no fevers Eye:  Pt is blind Ears:  No changes in hearing Nose/Mouth/Throat:  no complaints of nasal congestion, no sore throat Cardiovascular: no chest pain Respiratory:  No shortness of breath Gastrointestinal:  No change in bowel habits GU:  No frequency Integumentary:  no abnormal skin lesions reported Neurologic:  no headaches Endocrine:  denies unexplained weight changes  Exam BP 138/80 (BP Location: Left Arm, Patient Position: Sitting, Cuff Size: Normal)   Pulse 85   Temp (!) 96.9 F (36.1 C) (Temporal)   Ht 5\' 11"  (1.803 m)   Wt 266 lb 6 oz (120.8 kg)   SpO2 98%   BMI 37.15 kg/m  General:  well developed, well nourished, in no apparent distress Skin:  no significant moles, warts, or growths Head:  no masses, lesions, or tenderness Eyes:  Pupils opaque b/l Ears:  canals without lesions, TMs shiny without retraction, no obvious effusion, no erythema Nose:  nares patent, septum midline, mucosa normal Throat/Pharynx:  lips and gingiva without lesion; tongue and uvula midline; non-inflamed pharynx; no exudates or postnasal drainage Lungs:  clear to auscultation, breath sounds equal bilaterally, no respiratory  distress Cardio:  regular rate and rhythm, no LE edema or bruits Rectal: Deferred GI: BS+, S, NT, ND, no masses or organomegaly Musculoskeletal:  symmetrical muscle groups noted without atrophy or deformity Neuro:  gait normal; deep tendon reflexes normal and symmetric Psych: well oriented with normal range of affect and appropriate judgment/insight  Assessment and Plan  Well adult exam - Plan: CBC, Comprehensive metabolic panel, Lipid panel, Hemoglobin A1c  Erectile dysfunction, unspecified erectile dysfunction type - Plan: Testosterone   Well 74 y.o. male. Counseled on diet and exercise. Other orders as above. Follow up in 3-6 mo pending labs.  The patient voiced understanding and  agreement to the plan.  Jilda Roche [place], DO 02/03/20 8:33 AM

## 2020-05-10 ENCOUNTER — Ambulatory Visit: Payer: Medicare Other | Admitting: Family Medicine

## 2020-05-23 ENCOUNTER — Other Ambulatory Visit: Payer: Self-pay

## 2020-05-23 ENCOUNTER — Ambulatory Visit: Payer: Medicare Other | Admitting: Family Medicine

## 2020-05-23 ENCOUNTER — Encounter: Payer: Self-pay | Admitting: Family Medicine

## 2020-05-23 VITALS — BP 124/80 | HR 61 | Temp 98.0°F | Ht 71.0 in | Wt 269.4 lb

## 2020-05-23 DIAGNOSIS — E1169 Type 2 diabetes mellitus with other specified complication: Secondary | ICD-10-CM

## 2020-05-23 DIAGNOSIS — Z23 Encounter for immunization: Secondary | ICD-10-CM | POA: Diagnosis not present

## 2020-05-23 DIAGNOSIS — E669 Obesity, unspecified: Secondary | ICD-10-CM | POA: Diagnosis not present

## 2020-05-23 MED ORDER — LEVEMIR FLEXTOUCH 100 UNIT/ML ~~LOC~~ SOPN
35.0000 [IU] | PEN_INJECTOR | Freq: Every day | SUBCUTANEOUS | 3 refills | Status: DC
Start: 2020-05-23 — End: 2020-07-19

## 2020-05-23 NOTE — Progress Notes (Signed)
Subjective:   Chief Complaint  Patient presents with  . Follow-up    diabetes    Jose Roberts is a 74 y.o. male here for follow-up of diabetes.   Dijon's self monitored glucose range is 140-150's in the afternoon.  Patient denies hypoglycemic reactions. Patient does require insulin. Levemir 35 u/d Medications include: Actos 30 mg/d Diet is healthy overall.  Exercise: some walking  Past Medical History:  Diagnosis Date  . Blind   . Diabetes mellitus   . Fistula   . Fournier's gangrene - s/p I&D May 2012 x2 02/19/2011  . Hemorrhoids   . Hypertension      Related testing: Retinal exam: Pt is blind Pneumovax: done  Objective:  BP 124/80 (BP Location: Left Arm, Patient Position: Sitting, Cuff Size: Large)   Pulse 61   Temp 98 F (36.7 C) (Oral)   Ht 5\' 11"  (1.803 m)   Wt 269 lb 6 oz (122.2 kg)   SpO2 98%   BMI 37.57 kg/m  General:  Well developed, well nourished, in no apparent distress Skin:  Warm, no pallor or diaphoresis Lungs:  CTAB, no access msc use Cardio:  RRR Psych: Age appropriate judgment and insight  Assessment:   Diabetes mellitus type 2 in obese (HCC) - Plan: Hemoglobin A1c  Need for influenza vaccination - Plan: Flu Vaccine QUAD High Dose(Fluad)   Plan:   Status: Uncontrolled; Counseled on diet and exercise. Cont Levemir 35 u qhs and Actos 30 mg/d. Would consider adding back Metformin given less concern with NMDA contamination. Goal A1c is <8.  F/u in 3-6 mo. The patient voiced understanding and agreement to the plan.  [place], DO 05/23/20 8:26 AM

## 2020-05-23 NOTE — Patient Instructions (Addendum)
Give [place] 2-3 business days to get the results of your labs back.  Our follow up will be based on the A1c result.  Continue on what you are on until you hear from me.   Keep the diet clean and stay active.  Let us know if you need anything.

## 2020-05-24 ENCOUNTER — Other Ambulatory Visit: Payer: Self-pay | Admitting: Family Medicine

## 2020-05-24 LAB — HEMOGLOBIN A1C
Hgb A1c MFr Bld: 8.2 % of total Hgb — ABNORMAL HIGH (ref <5.7)
Mean Plasma Glucose: 189 (calc)
eAG (mmol/L): 10.4 (calc)

## 2020-05-24 MED ORDER — METFORMIN HCL ER 500 MG PO TB24: 1000.0000 mg | ORAL_TABLET | Freq: Every day | ORAL | 2 refills | Status: DC

## 2020-05-25 ENCOUNTER — Telehealth: Payer: Self-pay | Admitting: Family Medicine

## 2020-05-25 MED ORDER — SILDENAFIL CITRATE 100 MG PO TABS: 100.0000 mg | ORAL_TABLET | ORAL | 2 refills | Status: DC | PRN

## 2020-05-25 NOTE — Telephone Encounter (Signed)
Medication: sildenafil (VIAGRA) 100 MG tablet [[id number]]     Has the patient contacted their pharmacy?  (If no, request that the patient contact the pharmacy for the refill.) (If yes, when and what did the pharmacy advise?)     Preferred Pharmacy (with phone number or street name):   [facility] - [place], [place] - [address]  [address] [place] [address]  Phone:  [phone] Fax:  [phone]   Agent: Please be advised that RX refills may take up to 3 business days. We ask that you follow-up with your pharmacy.

## 2020-05-25 NOTE — Telephone Encounter (Signed)
Refill done.  

## 2020-07-05 ENCOUNTER — Other Ambulatory Visit: Payer: Self-pay | Admitting: Family Medicine

## 2020-07-19 ENCOUNTER — Other Ambulatory Visit: Payer: Self-pay | Admitting: Family Medicine

## 2020-08-08 ENCOUNTER — Ambulatory Visit: Payer: Medicare Other | Admitting: Family Medicine

## 2020-08-09 ENCOUNTER — Other Ambulatory Visit: Payer: Self-pay | Admitting: Family Medicine

## 2020-08-24 ENCOUNTER — Other Ambulatory Visit: Payer: Self-pay

## 2020-08-24 ENCOUNTER — Encounter: Payer: Self-pay | Admitting: Family Medicine

## 2020-08-24 ENCOUNTER — Other Ambulatory Visit: Payer: Self-pay | Admitting: Family Medicine

## 2020-08-24 ENCOUNTER — Ambulatory Visit: Payer: Medicare Other | Admitting: Family Medicine

## 2020-08-24 VITALS — BP 138/80 | HR 70 | Temp 97.6°F | Ht 71.0 in | Wt 274.2 lb

## 2020-08-24 DIAGNOSIS — E1169 Type 2 diabetes mellitus with other specified complication: Secondary | ICD-10-CM

## 2020-08-24 DIAGNOSIS — E669 Obesity, unspecified: Secondary | ICD-10-CM | POA: Diagnosis not present

## 2020-08-24 DIAGNOSIS — B353 Tinea pedis: Secondary | ICD-10-CM

## 2020-08-24 DIAGNOSIS — G4700 Insomnia, unspecified: Secondary | ICD-10-CM | POA: Diagnosis not present

## 2020-08-24 LAB — HEMOGLOBIN A1C: Hgb A1c MFr Bld: 7.9 % — ABNORMAL HIGH (ref 4.6–6.5)

## 2020-08-24 MED ORDER — METFORMIN HCL ER 500 MG PO TB24
ORAL_TABLET | ORAL | 2 refills | Status: DC
Start: 2020-08-24 — End: 2021-06-23

## 2020-08-24 MED ORDER — TRAZODONE HCL 50 MG PO TABS: 25.0000 mg | ORAL_TABLET | Freq: Every evening | ORAL | 3 refills | Status: DC | PRN

## 2020-08-24 MED ORDER — VALSARTAN 160 MG PO TABS: 160.0000 mg | ORAL_TABLET | Freq: Every day | ORAL | Status: DC

## 2020-08-24 MED ORDER — KETOCONAZOLE 2 % EX CREA: 1.0000 "application " | TOPICAL_CREAM | Freq: Every day | CUTANEOUS | 0 refills | Status: AC

## 2020-08-24 NOTE — Patient Instructions (Signed)
Give [place] 2-3 business days to get the results of your labs back.   Keep the diet clean and stay active.  Continue to monitor your blood sugars at home.  Keep the feet as dry as possible.  Let us know if you need anything.

## 2020-08-24 NOTE — Progress Notes (Signed)
Subjective:   Chief Complaint  Patient presents with  . Diabetes    Jose Roberts is a 75 y.o. male here for follow-up of diabetes.   Jose Roberts's self monitored glucose range is 90, low 100's.  They were higher during the holidays around mid 100's.  Patient denies hypoglycemic reactions. He checks his glucose levels several times per week Patient does require insulin.  Levemir 35 u nightly Medications include: Metformin XR 1000 mg/d, Actos 30 mg/d Diet is healthy outside of holidays.  Exercise: walking  Insomnia Has had several years of difficulty staying asleep. He is still working and has difficulty on days he does not sleep. He tried trazodone in past and does not remember why he stopped.   Past Medical History:  Diagnosis Date  . Blind   . Diabetes mellitus   . Fistula   . Fournier's gangrene - s/p I&D May 2012 x2 02/19/2011  . Hemorrhoids   . Hypertension      Related testing: Retinal exam: N/A, pt is blind Pneumovax: done  Objective:  BP 138/80 (BP Location: Left Arm, Patient Position: Sitting, Cuff Size: Large)   Pulse 70   Temp 97.6 F (36.4 C) (Oral)   Ht 5\' 11"  (1.803 m)   Wt 274 lb 4 oz (124.4 kg)   SpO2 98%   BMI 38.25 kg/m  General:  Well developed, well nourished, in no apparent distress Skin:  Macerated tissue between all digits other than 1/2 on the R; Warm, no pallor or diaphoresis Head:  Normocephalic, atraumatic Lungs:  CTAB, no access msc use Cardio:  RRR, no bruits Musculoskeletal:  Symmetrical muscle groups noted without atrophy or deformity Neuro:  Sensation intact to pinprick on feet w exception of 3rd distal MT region b/l Psych: Age appropriate judgment and insight  Assessment:   Diabetes mellitus type 2 in obese (HCC) - Plan: Hemoglobin A1c  Insomnia, unspecified type - Plan: traZODone (DESYREL) 50 MG tablet  Tinea pedis of both feet - Plan: ketoconazole (NIZORAL) 2 % cream   Plan:   1. Sounds like home readings are good when he is  making good food choices. Cont Metformin XR 1000 mg/d, Actos 30 mg/d, Levemir 35 mg qhs. Counseled on diet and exercise. Cont to monitor sugars. He will have his wife monitor his feet for sores and callous formation.  2. Trial trazodone again. F/u in 1 mo if no better. 3. 6 week course of ketoconazole. The anatomy of his toes push them together more so and likely increase risk of this. Advised to air out feet when able.  F/u in 3-6 mo pending above. The patient voiced understanding and agreement to the plan.  [place], DO 08/24/20 7:35 AM

## 2020-09-28 ENCOUNTER — Other Ambulatory Visit: Payer: Self-pay | Admitting: Family Medicine

## 2020-10-08 ENCOUNTER — Other Ambulatory Visit: Payer: Self-pay

## 2020-10-10 MED ORDER — PIOGLITAZONE HCL 30 MG PO TABS: 30.0000 mg | ORAL_TABLET | Freq: Every day | ORAL | 0 refills | Status: DC

## 2020-10-18 ENCOUNTER — Telehealth: Payer: Self-pay | Admitting: Family Medicine

## 2020-10-18 MED ORDER — SILDENAFIL CITRATE 100 MG PO TABS: 100.0000 mg | ORAL_TABLET | ORAL | 2 refills | Status: DC | PRN

## 2020-10-18 NOTE — Telephone Encounter (Signed)
Medication: sildenafil (VIAGRA) 100 MG tablet [[id number]]       Has the patient contacted their pharmacy?  (If no, request that the patient contact the pharmacy for the refill.) (If yes, when and what did the pharmacy advise?)     Preferred Pharmacy (with phone number or street name): [facility] - [place], [place] - [address]  [address] [place] [address]  Phone:  [phone] Fax:  [phone]      Agent: Please be advised that RX refills may take up to 3 business days. We ask that you follow-up with your pharmacy.

## 2020-10-18 NOTE — Telephone Encounter (Signed)
Medication refilled

## 2020-11-25 ENCOUNTER — Other Ambulatory Visit: Payer: Self-pay | Admitting: Family Medicine

## 2020-11-28 ENCOUNTER — Other Ambulatory Visit: Payer: Self-pay

## 2020-11-28 MED ORDER — VALSARTAN 160 MG PO TABS: 160.0000 mg | ORAL_TABLET | Freq: Every day | ORAL | Status: DC

## 2020-12-23 MED ORDER — PIOGLITAZONE HCL 30 MG PO TABS: 30.0000 mg | ORAL_TABLET | Freq: Every day | ORAL | 3 refills | Status: DC

## 2021-02-12 ENCOUNTER — Other Ambulatory Visit: Payer: Self-pay | Admitting: Family Medicine

## 2021-02-22 ENCOUNTER — Encounter: Payer: Medicare Other | Admitting: Family Medicine

## 2021-03-17 ENCOUNTER — Other Ambulatory Visit: Payer: Self-pay | Admitting: Family Medicine

## 2021-03-17 DIAGNOSIS — E1169 Type 2 diabetes mellitus with other specified complication: Secondary | ICD-10-CM

## 2021-03-22 ENCOUNTER — Other Ambulatory Visit: Payer: Self-pay

## 2021-03-22 ENCOUNTER — Encounter: Payer: Self-pay | Admitting: Family Medicine

## 2021-03-22 ENCOUNTER — Other Ambulatory Visit: Payer: Self-pay | Admitting: Family Medicine

## 2021-03-22 ENCOUNTER — Ambulatory Visit: Payer: Medicare Other | Admitting: Family Medicine

## 2021-03-22 VITALS — BP 118/80 | HR 82 | Temp 97.6°F | Ht 71.0 in | Wt 278.0 lb

## 2021-03-22 DIAGNOSIS — E1169 Type 2 diabetes mellitus with other specified complication: Secondary | ICD-10-CM | POA: Diagnosis not present

## 2021-03-22 DIAGNOSIS — E669 Obesity, unspecified: Secondary | ICD-10-CM | POA: Diagnosis not present

## 2021-03-22 DIAGNOSIS — Z0000 Encounter for general adult medical examination without abnormal findings: Secondary | ICD-10-CM | POA: Diagnosis not present

## 2021-03-22 LAB — COMPREHENSIVE METABOLIC PANEL
ALT: 12 U/L (ref 0–53)
AST: 15 U/L (ref 0–37)
Albumin: 4.1 g/dL (ref 3.5–5.2)
Alkaline Phosphatase: 49 U/L (ref 39–117)
BUN: 18 mg/dL (ref 6–23)
CO2: 28 mEq/L (ref 19–32)
Calcium: 9.9 mg/dL (ref 8.4–10.5)
Chloride: 101 mEq/L (ref 96–112)
Creatinine, Ser: 1.17 mg/dL (ref 0.40–1.50)
GFR: 61.75 mL/min (ref >60.00)
Glucose, Bld: 95 mg/dL (ref 70–99)
Potassium: 3.9 mEq/L (ref 3.5–5.1)
Sodium: 138 mEq/L (ref 135–145)
Total Bilirubin: 0.8 mg/dL (ref 0.2–1.2)
Total Protein: 7.3 g/dL (ref 6.0–8.3)

## 2021-03-22 LAB — CBC
HCT: 43.9 % (ref 39.0–52.0)
Hemoglobin: 14.3 g/dL (ref 13.0–17.0)
MCHC: 32.5 g/dL (ref 30.0–36.0)
MCV: 85.1 fl (ref 78.0–100.0)
Platelets: 234 10*3/uL (ref 150.0–400.0)
RBC: 5.15 Mil/uL (ref 4.22–5.81)
RDW: 14.7 % (ref 11.5–15.5)
WBC: 5.6 10*3/uL (ref 4.0–10.5)

## 2021-03-22 LAB — LIPID PANEL
Cholesterol: 175 mg/dL (ref 0–200)
HDL: 56.3 mg/dL (ref >39.00)
LDL Cholesterol: 104 mg/dL — ABNORMAL HIGH (ref 0–99)
NonHDL: 118.42
Total CHOL/HDL Ratio: 3
Triglycerides: 70 mg/dL (ref 0.0–149.0)
VLDL: 14 mg/dL (ref 0.0–40.0)

## 2021-03-22 LAB — HEMOGLOBIN A1C: Hgb A1c MFr Bld: 8.3 % — ABNORMAL HIGH (ref 4.6–6.5)

## 2021-03-22 LAB — MICROALBUMIN / CREATININE URINE RATIO
Creatinine,U: 83.1 mg/dL
Microalb Creat Ratio: 0.8 mg/g (ref 0.0–30.0)
Microalb, Ur: <0.7 mg/dL (ref 0.0–1.9)

## 2021-03-22 MED ORDER — LEVEMIR FLEXTOUCH 100 UNIT/ML ~~LOC~~ SOPN: 42.0000 [IU] | PEN_INJECTOR | Freq: Every day | SUBCUTANEOUS | 3 refills | Status: DC

## 2021-03-22 MED ORDER — PRAVASTATIN SODIUM 40 MG PO TABS: 40.0000 mg | ORAL_TABLET | Freq: Every day | ORAL | 3 refills | Status: DC

## 2021-03-22 NOTE — Patient Instructions (Addendum)
Give [place] 2-3 business days to get the results of your labs back.   Keep the diet clean and stay active.  The new Shingrix vaccine (for shingles) is a 2 shot series. It can make people feel low energy, achy and almost like they have the flu for 48 hours after injection. Please plan accordingly when deciding on when to get this shot. Call your pharmacy appointment to get this. The second shot of the series is less severe regarding the side effects, but it still lasts 48 hours.   I recommend getting the flu shot in mid October. This suggestion would change if the CDC comes out with a different recommendation.   Let us know if you need anything.

## 2021-03-22 NOTE — Progress Notes (Signed)
Chief Complaint  Patient presents with   Diabetes    Well Male Jose Roberts is here for a complete physical.   His last physical was >1 year ago.  Current diet: in general, diet has been OK.   Current exercise: walking Weight trend: up a few lbs Fatigue out of ordinary? No. Seat belt? Yes.    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     fluticasone (FLONASE) 50 MCG/ACT nasal spray Place 2 sprays into both nostrils daily. 16 g 6   glucose blood (PRODIGY NO CODING BLOOD GLUC) test strip USE TO CHECK BLOOD SUGAR 3  TIMES DAILY 300 strip 3   hydrochlorothiazide (HYDRODIURIL) 25 MG tablet Take 1 tablet by mouth once daily 90 tablet 0   Insulin Pen Needle (PEN NEEDLES) 32G X 4 MM MISC Inject 35 Units as directed daily. To use with levemir 90 each 0   LEVEMIR FLEXTOUCH 100 UNIT/ML FlexPen INJECT SUBCUTANEOUSLY 35  UNITS DAILY 45 mL 3   metFORMIN (GLUCOPHAGE-XR) 500 MG 24 hr tablet TAKE 2 TABLETS BY MOUTH ONCE DAILY WITH BREAKFAST 180 tablet 2   Multiple Vitamin (MULTIVITAMIN WITH MINERALS) TABS Take 1 tablet by mouth daily.     pioglitazone (ACTOS) 30 MG tablet Take 1 tablet (30 mg total) by mouth daily. 90 tablet 3   pravastatin (PRAVACHOL) 40 MG tablet Take 1 tablet by mouth  once daily 30 tablet 0   sildenafil (VIAGRA) 100 MG tablet Take 1 tablet (100 mg total) by mouth as needed for erectile dysfunction. 10 tablet 2   traZODone (DESYREL) 50 MG tablet Take 0.5-1 tablets (25-50 mg total) by mouth at bedtime as needed for sleep. 30 tablet 3   valsartan (DIOVAN) 160 MG tablet Take 1 tablet (160 mg total) by mouth daily.     valsartan (DIOVAN) 160 MG tablet Take 1 tablet by mouth once daily 90 tablet 0    Allergies No Known Allergies  Family History Family History  Problem Relation Age of Onset   Diabetes Mother    Hypertension Mother    Cancer Mother        pancreas   Diabetes Father    Hypertension Father    Diabetes Brother    Diabetes Sister     Review of Systems: Constitutional:  no fevers Eye:  no recent significant change in vision (pt is blind) Ears:  No changes in hearing Nose/Mouth/Throat:  no complaints of nasal congestion, no sore throat Cardiovascular: no chest pain Respiratory:  No shortness of breath Gastrointestinal:  No change in bowel habits GU:  +nocturia Integumentary:  no abnormal skin lesions reported Neurologic:  +  numbness in feet Endocrine:  denies unexplained weight changes  Exam BP 118/80   Pulse 82   Temp 97.6 F (36.4 C) (Oral)   Ht 5\' 11"  (1.803 m)   Wt 278 lb (126.1 kg)   SpO2 97%   BMI 38.77 kg/m  General:  well developed, well nourished, in no apparent distress Skin:  no significant moles, warts, or growths Head:  no masses, lesions, or tenderness Eyes:  Lens opaque b/l, sclera white w/o injection Ears:  canals without lesions, TMs shiny without retraction, no obvious effusion, no erythema Nose:  nares patent, septum midline, mucosa normal Throat/Pharynx:  lips and gingiva without lesion; tongue and uvula midline; non-inflamed pharynx; no exudates or postnasal drainage Lungs:  clear to auscultation, breath sounds equal bilaterally, no respiratory distress Cardio:  regular rate and rhythm, no LE edema or  bruits Rectal: Deferred GI: BS+, S, NT, ND, no masses or organomegaly Musculoskeletal:  symmetrical muscle groups noted without atrophy or deformity Neuro:  gait normal; deep tendon reflexes normal and symmetric Psych: well oriented with normal range of affect and appropriate judgment/insight  Assessment and Plan  Well adult exam  Diabetes mellitus type 2 in obese (HCC) - Plan: CBC, Comprehensive metabolic panel, Lipid panel, Microalbumin / creatinine urine ratio, Hemoglobin A1c   Well 75 y.o. male. Counseled on diet and exercise. Other orders as above. Goal A1c < 8. Shingrix rec'd. Flu shot rec'd for Oct.  Numbness could be related to footwear but also diabetic neuropathy. He will see if he has it when he is not wearing shoes. Not bothering enough to start a medicine. Having nocturia, likely 2/2 BPH. Will let me know when it is bothering him enough to take a med. Discussed double voiding, avoiding caffeine/alcohol, drinking before bed.  Follow up in 6 mo pending above.  The patient voiced understanding and agreement to the plan.  [place], DO 03/22/21 7:52 AM

## 2021-03-24 ENCOUNTER — Other Ambulatory Visit: Payer: Self-pay | Admitting: Family Medicine

## 2021-03-24 MED ORDER — LEVEMIR FLEXTOUCH 100 UNIT/ML ~~LOC~~ SOPN: 38.0000 [IU] | PEN_INJECTOR | Freq: Every day | SUBCUTANEOUS | 3 refills | Status: DC

## 2021-05-22 ENCOUNTER — Other Ambulatory Visit: Payer: Self-pay | Admitting: Family Medicine

## 2021-06-19 ENCOUNTER — Other Ambulatory Visit: Payer: Self-pay | Admitting: Family Medicine

## 2021-06-19 MED ORDER — SILDENAFIL CITRATE 100 MG PO TABS: 100.0000 mg | ORAL_TABLET | ORAL | 2 refills | Status: DC | PRN

## 2021-06-21 ENCOUNTER — Telehealth: Payer: Self-pay

## 2021-06-21 NOTE — Telephone Encounter (Signed)
Spoke with patient regarding symptoms.  Patient states leg is better and can wait until appt on Friday with PCP for assessment.    [facility] - Client TELEPHONE ADVICE RECORD [facility] Patient Name:Jose Roberts Reason for Call Symptomatic / Request for Health Information Initial Comment Caller states he is diabetic. He fell and hurt his leg 3 weeks ago and today his leg is swollen and discolored. Translation No Disp. Time Lamount Cohen Time) Disposition Final User 06/21/2021 9:04:29 AM Attempt made - message left Lily Kocher, RN, Adriana 06/21/2021 9:18:06 AM FINAL ATTEMPT MADE - message left Yes Lily Kocher, RN, Ricki Rodriguez

## 2021-06-23 ENCOUNTER — Other Ambulatory Visit: Payer: Self-pay | Admitting: Family Medicine

## 2021-06-23 ENCOUNTER — Encounter: Payer: Self-pay | Admitting: Family Medicine

## 2021-06-23 ENCOUNTER — Other Ambulatory Visit: Payer: Self-pay

## 2021-06-23 ENCOUNTER — Ambulatory Visit: Payer: Medicare Other | Admitting: Family Medicine

## 2021-06-23 VITALS — BP 130/82 | HR 81 | Temp 97.8°F | Ht 71.0 in | Wt 275.0 lb

## 2021-06-23 DIAGNOSIS — E1169 Type 2 diabetes mellitus with other specified complication: Secondary | ICD-10-CM | POA: Diagnosis not present

## 2021-06-23 DIAGNOSIS — E669 Obesity, unspecified: Secondary | ICD-10-CM | POA: Diagnosis not present

## 2021-06-23 DIAGNOSIS — L810 Postinflammatory hyperpigmentation: Secondary | ICD-10-CM

## 2021-06-23 LAB — HEMOGLOBIN A1C: Hgb A1c MFr Bld: 8.9 % — ABNORMAL HIGH (ref 4.6–6.5)

## 2021-06-23 MED ORDER — METFORMIN HCL ER 500 MG PO TB24: ORAL_TABLET | ORAL | 2 refills | Status: DC

## 2021-06-23 MED ORDER — METFORMIN HCL ER 500 MG PO TB24
ORAL_TABLET | ORAL | 2 refills | Status: DC
Start: 2021-06-23 — End: 2022-05-02

## 2021-06-23 MED ORDER — LEVEMIR FLEXTOUCH 100 UNIT/ML ~~LOC~~ SOPN: 38.0000 [IU] | PEN_INJECTOR | Freq: Every day | SUBCUTANEOUS | 3 refills | Status: DC

## 2021-06-23 MED ORDER — SILDENAFIL CITRATE 100 MG PO TABS
100.0000 mg | ORAL_TABLET | ORAL | 2 refills | Status: DC | PRN
Start: 2021-06-23 — End: 2022-12-05

## 2021-06-23 NOTE — Progress Notes (Signed)
Subjective:   Chief Complaint  Patient presents with   Follow-up    DM Hurt right leg on bed frame    Jose Roberts is a 75 y.o. male here for follow-up of diabetes.   Jose Roberts's self monitored glucose range is 100's.  Patient denies hypoglycemic reactions. He checks his glucose levels 2 time(s) per day. Patient does require insulin. Levemir 38 u qhs Medications include: Metformin XR 1000 mg/d, Actos 30 mg/d Diet is not the healthiest right now.  Exercise: none No Cp or SOB.   1 month ago, the patient fell against a bed frame.  He had initial pain and continued again a few times over the past month.  He is now experiencing a dark spot on his right anterior lower extremity.  He has slight pain but that is improving in addition to swelling.  No drainage, redness, bruising.  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 No excessive warmth, excoriation, fluctuance, drainage, ttp. Lungs:  CTAB, no access msc use Cardio:  RRR, no bruits, no LE edema Psych: Age appropriate judgment and insight  Assessment:   Diabetes mellitus type 2 in obese (HCC) - Plan: Hemoglobin A1c  Post-inflammatory hyperpigmentation   Plan:   Chronic, unstable. Cont Levemir 38 u qhs, Metformin XR 1000 mg/d, Actos 30 mg/d. Ck labs today. A1c goal is <8. Counseled on diet and exercise.  Due to his poor diet, he would strongly prefer to modify his lifestyle prior to adjusting his medication.  Depending on his A1c, I think we could accommodate for  this. Reassurance for now.  By failed COVID-vaccine recommended.  Shingrix also recommended. F/u in 3-6 mo pending the above. The patient voiced understanding and agreement to the plan.  [place], DO 06/23/21 11:54 AM

## 2021-06-23 NOTE — Patient Instructions (Addendum)
Give [place] 2-3 business days to get the results of your labs back.   Keep the diet clean and stay active.  The skin is a natural change after an area of inflammation. This could take months to go back to normal. The pain should resolve in 2-3 more weeks.  Let us know if you need anything.  Healthy Eating Plan Many factors influence your heart health, including eating and exercise habits. Heart (coronary) risk increases with abnormal blood fat (lipid) levels. Heart-healthy meal planning includes limiting unhealthy fats, increasing healthy fats, and making other small dietary changes. This includes maintaining a healthy body weight to help keep lipid levels within a normal range.  WHAT IS MY PLAN?  Your health care provider recommends that you: Drink a glass of water before meals to help with satiety. Eat slowly. An alternative to the water is to add Metamucil. This will help with satiety as well. It does contain calories, unlike water.  WHAT TYPES OF FAT SHOULD I CHOOSE? Choose healthy fats more often. Choose monounsaturated and polyunsaturated fats, such as olive oil and canola oil, flaxseeds, walnuts, almonds, and seeds. Eat more omega-3 fats. Good choices include salmon, mackerel, sardines, tuna, flaxseed oil, and ground flaxseeds. Aim to eat fish at least two times each week. Avoid foods with partially hydrogenated oils in them. These contain trans fats. Examples of foods that contain trans fats are stick margarine, some tub margarines, cookies, crackers, and other baked goods. If you are going to avoid a fat, this is the one to avoid!  WHAT GENERAL GUIDELINES DO I NEED TO FOLLOW? Check food labels carefully to identify foods with trans fats. Avoid these types of options when possible. Fill one half of your plate with vegetables and green salads. Eat 4-5 servings of vegetables per day. A serving of vegetables equals 1 cup of raw leafy vegetables,  cup of raw or cooked cut-up vegetables, or   cup of vegetable juice. Fill one fourth of your plate with whole grains. Look for the word "whole" as the first word in the ingredient list. Fill one fourth of your plate with lean protein foods. Eat 4-5 servings of fruit per day. A serving of fruit equals one medium whole fruit,  cup of dried fruit,  cup of fresh, frozen, or canned fruit. Try to avoid fruits in cups/syrups as the sugar content can be high. Eat more foods that contain soluble fiber. Examples of foods that contain this type of fiber are apples, broccoli, carrots, beans, peas, and barley. Aim to get 20-30 g of fiber per day. Eat more home-cooked food and less restaurant, buffet, and fast food. Limit or avoid alcohol. Limit foods that are high in starch and sugar. Avoid fried foods when able. Cook foods by using methods other than frying. Baking, boiling, grilling, and broiling are all great options. Other fat-reducing suggestions include: Removing the skin from poultry. Removing all visible fats from meats. Skimming the fat off of stews, soups, and gravies before serving them. Steaming vegetables in water or broth. Lose weight if you are overweight. Losing just 5-10% of your initial body weight can help your overall health and prevent diseases such as diabetes and heart disease. Increase your consumption of nuts, legumes, and seeds to 4-5 servings per week. One serving of dried beans or legumes equals  cup after being cooked, one serving of nuts equals 1 ounces, and one serving of seeds equals  ounce or 1 tablespoon.  WHAT ARE GOOD FOODS CAN I  EAT? Grains Grainy breads (try to find bread that is 3 g of fiber per slice or greater), oatmeal, light popcorn. Whole-grain cereals. Rice and pasta, including brown rice and those that are made with whole wheat. Edamame pasta is a great alternative to grain pasta. It has a higher protein content. Try to avoid significant consumption of white bread, sugary cereals, or pastries/baked  goods.  Vegetables All vegetables. Cooked white potatoes do not count as vegetables.  Fruits All fruits, but limit pineapple and bananas as these fruits have a higher sugar content.  Meats and Other Protein Sources Lean, well-trimmed beef, veal, pork, and lamb. Chicken and [place] without skin. All fish and shellfish. Wild duck, rabbit, pheasant, and venison. Egg whites or low-cholesterol egg substitutes. Dried beans, peas, lentils, and tofu. Seeds and most nuts.  Dairy Low-fat or nonfat cheeses, including ricotta, string, and mozzarella. Skim or 1% milk that is liquid, powdered, or evaporated. Buttermilk that is made with low-fat milk. Nonfat or low-fat yogurt. Soy/Almond milk are good alternatives if you cannot handle dairy.  Beverages Water is the best for you. Sports drinks with less sugar are more desirable unless you are a highly active athlete.  Sweets and Desserts Sherbets and fruit ices. Honey, jam, marmalade, jelly, and syrups. Dark chocolate.  Eat all sweets and desserts in moderation.  Fats and Oils Nonhydrogenated (trans-free) margarines. Vegetable oils, including soybean, sesame, sunflower, olive, peanut, safflower, corn, canola, and cottonseed. Salad dressings or mayonnaise that are made with a vegetable oil. Limit added fats and oils that you use for cooking, baking, salads, and as spreads.  Other Cocoa powder. Coffee and tea. Most condiments.  The items listed above may not be a complete list of recommended foods or beverages. Contact your dietitian for more options.

## 2021-06-26 ENCOUNTER — Ambulatory Visit: Payer: Medicare Other | Admitting: Family Medicine

## 2021-08-10 NOTE — Progress Notes (Signed)
Subjective:   Jose Roberts is a 75 y.o. male who presents for an Initial Medicare Annual Wellness Visit.  I connected with Jose Roberts today by telephone and verified that I am speaking with the correct person using two identifiers. Location patient: home Location provider: work Persons participating in the virtual visit: patient, Engineer, civil (consulting).    I discussed the limitations, risks, security and privacy concerns of performing an evaluation and management service by telephone and the availability of in person appointments. I also discussed with the patient that there may be a patient responsible charge related to this service. The patient expressed understanding and verbally consented to this telephonic visit.    Interactive audio and video telecommunications were attempted between this provider and patient, however failed, due to patient having technical difficulties OR patient did not have access to video capability.  We continued and completed visit with audio only.  Some vital signs may be absent or patient reported.   Time Spent with patient on telephone encounter: 20 minutes   Review of Systems     Cardiac Risk Factors include: male gender;advanced age (>72men, >78 women);hypertension;diabetes mellitus;dyslipidemia;obesity (BMI >30kg/m2)     Objective:    Today's Vitals   08/11/21 0741  Weight: 275 lb (124.7 kg)  Height: 5\' 11"  (1.803 m)   Body mass index is 38.35 kg/m.  Advanced Directives 08/11/2021 01/12/2020 09/22/2019 03/06/2017  Does Patient Have a Medical Advance Directive? Yes No No No  Type of 03/08/2017 of [place];Living will - - -  Copy of Healthcare Power of Attorney in Chart? No - copy requested - - -  Would patient like information on creating a medical advance directive? - No - Patient declined - -    Current Medications (verified) Outpatient Encounter Medications as of 08/11/2021  Medication Sig   amLODipine (NORVASC) 10 MG tablet Take 1  tablet by mouth once daily   aspirin EC 81 MG tablet Take 81 mg by mouth daily.   fluticasone (FLONASE) 50 MCG/ACT nasal spray Place 2 sprays into both nostrils daily.   glucose blood (PRODIGY NO CODING BLOOD GLUC) test strip USE TO CHECK BLOOD SUGAR 3  TIMES DAILY   hydrochlorothiazide (HYDRODIURIL) 25 MG tablet Take 1 tablet by mouth once daily   insulin detemir (LEVEMIR FLEXTOUCH) 100 UNIT/ML FlexPen Inject 38 Units into the skin daily.   Insulin Pen Needle (PEN NEEDLES) 32G X 4 MM MISC Inject 35 Units as directed daily. To use with levemir   metFORMIN (GLUCOPHAGE-XR) 500 MG 24 hr tablet TAKE 2 TABLETS BY MOUTH TWICE DAILY WITH BREAKFAST AND SUPPER   Multiple Vitamin (MULTIVITAMIN WITH MINERALS) TABS Take 1 tablet by mouth daily.   pioglitazone (ACTOS) 30 MG tablet Take 1 tablet (30 mg total) by mouth daily.   pravastatin (PRAVACHOL) 40 MG tablet Take 1 tablet (40 mg total) by mouth daily.   sildenafil (VIAGRA) 100 MG tablet Take 1 tablet (100 mg total) by mouth as needed for erectile dysfunction.   traZODone (DESYREL) 50 MG tablet Take 0.5-1 tablets (25-50 mg total) by mouth at bedtime as needed for sleep.   valsartan (DIOVAN) 160 MG tablet Take 1 tablet by mouth once daily   No facility-administered encounter medications on file as of 08/11/2021.    Allergies (verified) Patient has no known allergies.   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 02/19/2011   Hemorrhoids    Hypertension    Past Surgical History:  Procedure Laterality Date   EYE SURGERY     HEMORRHOID SURGERY     revision   RETINAL DETACHMENT SURGERY     surgery on gangrene  May 6/7/8, 2012 (x 3 debridements)   Fournier's Gangrene of perineum   Family History  Problem Relation Age of Onset   Diabetes Mother    Hypertension Mother    Cancer Mother        pancreas   Diabetes Father    Hypertension Father    Diabetes  Brother    Diabetes Sister    Social History   Socioeconomic History   Marital status: Married    Spouse name: Not on file   Number of children: Not on file   Years of education: Not on file   Highest education level: Not on file  Occupational History   Not on file  Tobacco Use   Smoking status: Never   Smokeless tobacco: Never  Substance and Sexual Activity   Alcohol use: Yes    Alcohol/week: 12.0 standard drinks    Types: 12 Cans of beer per week    Comment: per week   Drug use: No   Sexual activity: Not Currently  Other Topics Concern   Not on file  Social History Narrative   Not on file   Social Determinants of Health   Financial Resource Strain: Low Risk    Difficulty of Paying Living Expenses: Not hard at all  Food Insecurity: No Food Insecurity   Worried About Programme researcher, broadcasting/film/video in the Last Year: Never true   Ran Out of Food in the Last Year: Never true  Transportation Needs: No Transportation Needs   Lack of Transportation (Medical): No   Lack of Transportation (Non-Medical): No  Physical Activity: Inactive   Days of Exercise per Week: 0 days   Minutes of Exercise per Session: 0 min  Stress: No Stress Concern Present   Feeling of Stress : Not at all  Social Connections: Socially Integrated   Frequency of Communication with Friends and Family: More than three times a week   Frequency of Social Gatherings with Friends and Family: More than three times a week   Attends Religious Services: More than 4 times per year   Active Member of [employer] or Organizations: Yes   Attends Engineer, structural: More than 4 times per year   Marital Status: Married    Tobacco Counseling Counseling given: Not Answered   Clinical Intake:  Pre-visit preparation completed: Yes  Pain : No/denies pain     BMI - recorded: 38.35 Nutritional Status: BMI > 30  Obese Nutritional Risks: None Diabetes: Yes CBG done?: No Did pt. bring in CBG monitor from home?: No  (phone visit)     Diabetes:  Is the patient diabetic?  Yes  If diabetic, was a CBG obtained today?  No  Did the patient bring in their glucometer from home?  No phone visit How often do you monitor your CBG's? 2-3 times per week.   Financial Strains and Diabetes Management:  Are you having any financial strains with the device, your supplies or your medication? No .  Does the patient want to be seen by Chronic Care Management for management of their diabetes?  No  Would the patient like to be referred to a Nutritionist or for Diabetic Management?  No   Diabetic Exams:  Diabetic Eye  Exam: Patient is blind  Diabetic Foot Exam: Completed 08/24/2020   Interpreter Needed?: No  Information entered by :: Thomasenia Sales LPN   Activities of Daily Living In your present state of health, do you have any difficulty performing the following activities: 08/11/2021 03/22/2021  Hearing? N N  Vision? N Y  Comment - patient is blind  Difficulty concentrating or making decisions? N N  Walking or climbing stairs? N Y  Comment - with assistance due to eye sight  Dressing or bathing? N N  Doing errands, shopping? N Y  Quarry manager and eating ? N -  Using the Toilet? N -  In the past six months, have you accidently leaked urine? N -  Do you have problems with loss of bowel control? N -  Managing your Medications? N -  Managing your Finances? N -  Housekeeping or managing your Housekeeping? N -  Some recent data might be hidden    Patient Care Team: Sharlene Dory, DO as PCP - General (Family Medicine)  Indicate any recent Medical Services you may have received from other than [facility] providers in the past year (date may be approximate).     Assessment:   This is a routine wellness examination for Jose Roberts.  Hearing/Vision screen Hearing Screening - Comments:: No issues Vision Screening - Comments:: Patient is blind  Dietary issues and exercise activities discussed: Current  Exercise Habits: The patient does not participate in regular exercise at present, Exercise limited by: Other - see comments (pt is blind)   Goals Addressed             This Visit's Progress    Patient Stated       Eat healthier & walk more       Depression Screen PHQ 2/9 Scores 08/11/2021 03/22/2021 02/03/2020 05/09/2018  PHQ - 2 Score 0 0 0 0    Fall Risk Fall Risk  08/11/2021 03/22/2021 02/03/2020 05/09/2018  Falls in the past year? 1 0 1 No  Comment - - Due to blindness -  Number falls in past yr: 0 0 0 -  Injury with Fall? 0 0 - -  Risk for fall due to : History of fall(s);Impaired vision No Fall Risks - Impaired vision  Follow up Falls prevention discussed Falls evaluation completed - -    FALL RISK PREVENTION PERTAINING TO THE HOME:  Any stairs in or around the home? Yes  If so, are there any without handrails? No  Home free of loose throw rugs in walkways, pet beds, electrical cords, etc? Yes  Adequate lighting in your home to reduce risk of falls? Yes   ASSISTIVE DEVICES UTILIZED TO PREVENT FALLS:  Life alert? No  Use of a cane, walker or w/c? Yes  Grab bars in the bathroom? Yes  Shower chair or bench in shower? Yes  Elevated toilet seat or a handicapped toilet? Yes   TIMED UP AND GO:  Was the test performed? No . Phone visit   Cognitive Function:Normal cognitive status assessed by this Nurse Health Advisor. No abnormalities found.          Immunizations Immunization History  Administered Date(s) Administered   Fluad Quad(high Dose 65+) 05/23/2020   Influenza, High Dose Seasonal PF 05/09/2018, 05/23/2021   Influenza-Unspecified 05/14/2019   PFIZER(Purple Top)SARS-COV-2 Vaccination 09/04/2019, 10/14/2019, 06/03/2020, 12/05/2020   Pfizer Covid-19 Vaccine Bivalent Booster 41yrs & up 06/13/2021   Pneumococcal Conjugate-13 01/03/2016   Pneumococcal Polysaccharide-23 11/02/2010, 01/31/2018   Tdap 05/09/2011  Zoster, Live 06/22/2011    TDAP status:  Due, Education has been provided regarding the importance of this vaccine. Advised may receive this vaccine at local pharmacy or Health Dept. Aware to provide a copy of the vaccination record if obtained from local pharmacy or Health Dept. Verbalized acceptance and understanding.  Flu Vaccine status: Up to date  Pneumococcal vaccine status: Up to date  Covid-19 vaccine status: Completed vaccines  Qualifies for Shingles Vaccine? Yes   Zostavax completed Yes   Shingrix Completed?: No.    Education has been provided regarding the importance of this vaccine. Patient has been advised to call insurance company to determine out of pocket expense if they have not yet received this vaccine. Advised may also receive vaccine at local pharmacy or Health Dept. Verbalized acceptance and understanding.  Screening Tests Health Maintenance  Topic Date Due   Zoster Vaccines- Shingrix (1 of 2) 12/21/2021 (Originally 07/02/1997)   FOOT EXAM  08/24/2021   HEMOGLOBIN A1C  12/21/2021   COLONOSCOPY (Pts 45-74yrs Insurance coverage will need to be confirmed)  06/12/2025   Pneumonia Vaccine 17+ Years old  Completed   INFLUENZA VACCINE  Completed   COVID-19 Vaccine  Completed   Hepatitis C Screening  Completed   HPV VACCINES  Aged Out   OPHTHALMOLOGY EXAM  Discontinued   TETANUS/TDAP  Discontinued    Health Maintenance  There are no preventive care reminders to display for this patient.   Colorectal cancer screening: Type of screening: Colonoscopy. Completed 06/13/2015. Repeat every 10 years  Lung Cancer Screening: (Low Dose CT Chest recommended if Age 18-80 years, 30 pack-year currently smoking OR have quit w/in 15years.) does not qualify.     Additional Screening:  Hepatitis C Screening: Completed 01/31/2018  Vision Screening: Patient is blind  Dental Screening: Recommended annual dental exams for proper oral hygiene  Community Resource Referral / Chronic Care Management: CRR required this  visit?  No   CCM required this visit?  No      Plan:     I have personally reviewed and noted the following in the patients chart:   Medical and social history Use of alcohol, tobacco or illicit drugs  Current medications and supplements including opioid prescriptions. Patient is not currently taking opioid prescriptions. Functional ability and status Nutritional status Physical activity Advanced directives List of other physicians Hospitalizations, surgeries, and ER visits in previous 12 months Vitals Screenings to include cognitive, depression, and falls Referrals and appointments  In addition, I have reviewed and discussed with patient certain preventive protocols, quality metrics, and best practice recommendations. A written personalized care plan for preventive services as well as general preventive health recommendations were provided to patient.   Due to this being a telephonic visit, the after visit summary with patients personalized plan was offered to patient via mail or my-chart. Patient would like to access on my-chart.   Roanna Raider, LPN   09/81/1914  Nurse health Advisor  Nurse Notes: None

## 2021-08-11 ENCOUNTER — Ambulatory Visit (INDEPENDENT_AMBULATORY_CARE_PROVIDER_SITE_OTHER): Payer: Medicare Other

## 2021-08-11 VITALS — Ht 71.0 in | Wt 275.0 lb

## 2021-08-11 DIAGNOSIS — Z0000 Encounter for general adult medical examination without abnormal findings: Secondary | ICD-10-CM

## 2021-08-11 NOTE — Patient Instructions (Signed)
Jose Roberts , Thank you for taking time to complete your Medicare Wellness Visit. I appreciate your ongoing commitment to your health goals. Please review the following plan we discussed and let me know if I can assist you in the future.   Screening recommendations/referrals: Colonoscopy: No longer required Recommended yearly ophthalmology/optometry visit for glaucoma screening and checkup Recommended yearly dental visit for hygiene and checkup  Vaccinations: Influenza vaccine: Up to date Pneumococcal vaccine: Up to date Tdap vaccine: Discuss with pharmacy Shingles vaccine: Discuss with pharmacy   Covid-19: Up to date  Advanced directives: Please bring a copy of Living Will and/or Healthcare Power of Attorney for your chart.   Conditions/risks identified: See problem list  Next appointment: Follow up in one year for your annual wellness visit.   Preventive Care 74 Years and Older, Male Preventive care refers to lifestyle choices and visits with your health care provider that can promote health and wellness. What does preventive care include? A yearly physical exam. This is also called an annual well check. Dental exams once or twice a year. Routine eye exams. Ask your health care provider how often you should have your eyes checked. Personal lifestyle choices, including: Daily care of your teeth and gums. Regular physical activity. Eating a healthy diet. Avoiding tobacco and drug use. Limiting alcohol use. Practicing safe sex. Taking low doses of aspirin every day. Taking vitamin and mineral supplements as recommended by your health care provider. What happens during an annual well check? The services and screenings done by your health care provider during your annual well check will depend on your age, overall health, lifestyle risk factors, and family history of disease. Counseling  Your health care provider may ask you questions about your: Alcohol use. Tobacco use. Drug  use. Emotional well-being. Home and relationship well-being. Sexual activity. Eating habits. History of falls. Memory and ability to understand (cognition). Work and work Astronomer. Screening  You may have the following tests or measurements: Height, weight, and BMI. Blood pressure. Lipid and cholesterol levels. These may be checked every 5 years, or more frequently if you are over 65 years old. Skin check. Lung cancer screening. You may have this screening every year starting at age 74 if you have a 30-pack-year history of smoking and currently smoke or have quit within the past 15 years. Fecal occult blood test (FOBT) of the stool. You may have this test every year starting at age 74. Flexible sigmoidoscopy or colonoscopy. You may have a sigmoidoscopy every 5 years or a colonoscopy every 10 years starting at age 74. Prostate cancer screening. Recommendations will vary depending on your family history and other risks. Hepatitis C blood test. Hepatitis B blood test. Sexually transmitted disease (STD) testing. Diabetes screening. This is done by checking your blood sugar (glucose) after you have not eaten for a while (fasting). You may have this done every 1-3 years. Abdominal aortic aneurysm (AAA) screening. You may need this if you are a current or former smoker. Osteoporosis. You may be screened starting at age 74 if you are at high risk. Talk with your health care provider about your test results, treatment options, and if necessary, the need for more tests. Vaccines  Your health care provider may recommend certain vaccines, such as: Influenza vaccine. This is recommended every year. Tetanus, diphtheria, and acellular pertussis (Tdap, Td) vaccine. You may need a Td booster every 10 years. Zoster vaccine. You may need this after age 62. Pneumococcal 13-valent conjugate (PCV13) vaccine. One dose  is recommended after age 64. Pneumococcal polysaccharide (PPSV23) vaccine. One dose is  recommended after age 62. Talk to your health care provider about which screenings and vaccines you need and how often you need them. This information is not intended to replace advice given to you by your health care provider. Make sure you discuss any questions you have with your health care provider. Document Released: 08/26/2015 Document Revised: 04/18/2016 Document Reviewed: 05/31/2015 Elsevier Interactive Patient Education  2017 [place] Prevention in the Home Falls can cause injuries. They can happen to people of all ages. There are many things you can do to make your home safe and to help prevent falls. What can I do on the outside of my home? Regularly fix the edges of walkways and driveways and fix any cracks. Remove anything that might make you trip as you walk through a door, such as a raised step or threshold. Trim any bushes or trees on the path to your home. Use bright outdoor lighting. Clear any walking paths of anything that might make someone trip, such as rocks or tools. Regularly check to see if handrails are loose or broken. Make sure that both sides of any steps have handrails. Any raised decks and porches should have guardrails on the edges. Have any leaves, snow, or ice cleared regularly. Use sand or salt on walking paths during winter. Clean up any spills in your garage right away. This includes oil or grease spills. What can I do in the bathroom? Use night lights. Install grab bars by the toilet and in the tub and shower. Do not use towel bars as grab bars. Use non-skid mats or decals in the tub or shower. If you need to sit down in the shower, use a plastic, non-slip stool. Keep the floor dry. Clean up any water that spills on the floor as soon as it happens. Remove soap buildup in the tub or shower regularly. Attach bath mats securely with double-sided non-slip rug tape. Do not have throw rugs and other things on the floor that can make you  trip. What can I do in the bedroom? Use night lights. Make sure that you have a light by your bed that is easy to reach. Do not use any sheets or blankets that are too big for your bed. They should not hang down onto the floor. Have a firm chair that has side arms. You can use this for support while you get dressed. Do not have throw rugs and other things on the floor that can make you trip. What can I do in the kitchen? Clean up any spills right away. Avoid walking on wet floors. Keep items that you use a lot in easy-to-reach places. If you need to reach something above you, use a strong step stool that has a grab bar. Keep electrical cords out of the way. Do not use floor polish or wax that makes floors slippery. If you must use wax, use non-skid floor wax. Do not have throw rugs and other things on the floor that can make you trip. What can I do with my stairs? Do not leave any items on the stairs. Make sure that there are handrails on both sides of the stairs and use them. Fix handrails that are broken or loose. Make sure that handrails are as long as the stairways. Check any carpeting to make sure that it is firmly attached to the stairs. Fix any carpet that is loose or worn. Avoid  having throw rugs at the top or bottom of the stairs. If you do have throw rugs, attach them to the floor with carpet tape. Make sure that you have a light switch at the top of the stairs and the bottom of the stairs. If you do not have them, ask someone to add them for you. What else can I do to help prevent falls? Wear shoes that: Do not have high heels. Have rubber bottoms. Are comfortable and fit you well. Are closed at the toe. Do not wear sandals. If you use a stepladder: Make sure that it is fully opened. Do not climb a closed stepladder. Make sure that both sides of the stepladder are locked into place. Ask someone to hold it for you, if possible. Clearly mark and make sure that you can  see: Any grab bars or handrails. First and last steps. Where the edge of each step is. Use tools that help you move around (mobility aids) if they are needed. These include: Canes. Walkers. Scooters. Crutches. Turn on the lights when you go into a dark area. Replace any light bulbs as soon as they burn out. Set up your furniture so you have a clear path. Avoid moving your furniture around. If any of your floors are uneven, fix them. If there are any pets around you, be aware of where they are. Review your medicines with your doctor. Some medicines can make you feel dizzy. This can increase your chance of falling. Ask your doctor what other things that you can do to help prevent falls. This information is not intended to replace advice given to you by your health care provider. Make sure you discuss any questions you have with your health care provider. Document Released: 05/26/2009 Document Revised: 01/05/2016 Document Reviewed: 09/03/2014 Elsevier Interactive Patient Education  2017 [employer].

## 2021-08-20 ENCOUNTER — Other Ambulatory Visit: Payer: Self-pay | Admitting: Family Medicine

## 2021-09-25 ENCOUNTER — Ambulatory Visit: Payer: Medicare Other | Admitting: Family Medicine

## 2021-09-26 ENCOUNTER — Ambulatory Visit: Payer: Medicare Other | Admitting: Family Medicine

## 2021-10-18 ENCOUNTER — Ambulatory Visit: Payer: Medicare Other | Admitting: Family Medicine

## 2021-10-18 ENCOUNTER — Encounter: Payer: Self-pay | Admitting: Family Medicine

## 2021-10-18 ENCOUNTER — Telehealth: Payer: Self-pay | Admitting: Family Medicine

## 2021-10-18 VITALS — BP 132/80 | HR 88 | Temp 97.8°F | Ht 71.0 in | Wt 267.1 lb

## 2021-10-18 DIAGNOSIS — E669 Obesity, unspecified: Secondary | ICD-10-CM

## 2021-10-18 DIAGNOSIS — B353 Tinea pedis: Secondary | ICD-10-CM

## 2021-10-18 DIAGNOSIS — I10 Essential (primary) hypertension: Secondary | ICD-10-CM | POA: Diagnosis not present

## 2021-10-18 DIAGNOSIS — E1169 Type 2 diabetes mellitus with other specified complication: Secondary | ICD-10-CM | POA: Diagnosis not present

## 2021-10-18 LAB — COMPREHENSIVE METABOLIC PANEL
ALT: 12 U/L (ref 0–53)
AST: 15 U/L (ref 0–37)
Albumin: 3.9 g/dL (ref 3.5–5.2)
Alkaline Phosphatase: 57 U/L (ref 39–117)
BUN: 16 mg/dL (ref 6–23)
CO2: 28 mEq/L (ref 19–32)
Calcium: 9.7 mg/dL (ref 8.4–10.5)
Chloride: 101 mEq/L (ref 96–112)
Creatinine, Ser: 1.14 mg/dL (ref 0.40–1.50)
GFR: 63.45 mL/min (ref >60.00)
Glucose, Bld: 91 mg/dL (ref 70–99)
Potassium: 3.6 mEq/L (ref 3.5–5.1)
Sodium: 138 mEq/L (ref 135–145)
Total Bilirubin: 0.6 mg/dL (ref 0.2–1.2)
Total Protein: 7 g/dL (ref 6.0–8.3)

## 2021-10-18 LAB — HEMOGLOBIN A1C: Hgb A1c MFr Bld: 7.7 % — ABNORMAL HIGH (ref 4.6–6.5)

## 2021-10-18 LAB — LIPID PANEL
Cholesterol: 126 mg/dL (ref 0–200)
HDL: 45.8 mg/dL (ref >39.00)
LDL Cholesterol: 67 mg/dL (ref 0–99)
NonHDL: 79.95
Total CHOL/HDL Ratio: 3
Triglycerides: 65 mg/dL (ref 0.0–149.0)
VLDL: 13 mg/dL (ref 0.0–40.0)

## 2021-10-18 MED ORDER — KETOCONAZOLE 2 % EX CREA: 1.0000 "application " | TOPICAL_CREAM | Freq: Every day | CUTANEOUS | 0 refills | Status: AC

## 2021-10-18 NOTE — Telephone Encounter (Signed)
Pt would like referral for Podiatry transferred to [facility] location  ? ?Please advise  ?

## 2021-10-18 NOTE — Patient Instructions (Addendum)
Give [place] 2-3 business days to get the results of your labs back.  ? ?Keep the diet clean and stay active. ? ?Please continue to check your sugars routinely at home. ? ?If you do not hear anything about your referral in the next 1-2 weeks, call our office and ask for an update. ? ?Let us know if you need anything. ?

## 2021-10-18 NOTE — Telephone Encounter (Signed)
Patient would like to know if the referral for the Podiatrist can be sent to someone in the [place] area. Please advise.  ?

## 2021-10-18 NOTE — Progress Notes (Signed)
Subjective:  ?CC: DM visit ? ?Jose Roberts is a 76 y.o. male here for follow-up of diabetes.   ?[place] self monitored glucose range is 130's.  ?Patient denies hypoglycemic reactions. ?He checks his glucose levels 2 time(s) per day. ?Patient does require insulin.   ?Medications include: Metformin XR 1000 mg bid, Actos 30 mg/d,  ?Diet is fair.  ?Exercise: none ?No Cp or SOB. ? ?Hypertension ?Patient presents for hypertension follow up. ?He does not routinely monitor home blood pressures. ?He is compliant with medications- Norvasc 10 mg/d, valsartan 160 mg/d, HCTZ 25 mg/d. ?Patient has these side effects of medication: none ?Diet/exercise as above.  ? ?Past Medical History:  ?Diagnosis Date  ? Blind   ? Diabetes mellitus   ? Fistula   ? Fournier's gangrene - s/p I&D May 2012 x2 02/19/2011  ? Hemorrhoids   ? Hypertension   ?  ? ?Related testing: ?Retinal exam: N/A, pt is blind ?Pneumovax: done ? ?Objective:  ?BP 132/80   Pulse 88   Temp 97.8 ?F (36.6 ?C) (Oral)   Ht 5\' 11"  (1.803 m)   Wt 267 lb 2 oz (121.2 kg)   SpO2 99%   BMI 37.26 kg/m?  ?General:  Well developed, well nourished, in no apparent distress ?Skin: Macerated tissue between 2/3 on L foot, 2/3, 3/4, 4/5 on R foot. Otherwise warm, no pallor or diaphoresis ?Head:  Normocephalic, atraumatic ?Lungs:  CTAB, no access msc use ?Cardio:  RRR, no bruits, no LE edema ?Musculoskeletal:  Symmetrical muscle groups noted without atrophy or deformity ?Neuro:  Sensation intact to pinprick on L foot, decreased over lateral plantar surface on R ?Psych: Age appropriate judgment and insight ? ?Assessment:  ? ?Diabetes mellitus type 2 in obese (HCC) - Plan: Comprehensive metabolic panel, Lipid panel, Hemoglobin A1c, Ambulatory referral to Podiatry ? ?Essential hypertension ? ?Tinea pedis of both feet - Plan: ketoconazole (NIZORAL) 2 % cream  ? ?Plan:  ? ?Chronic, not stable. Cont Metformin XR 1000 mg bid, Actos 30 mg/d, Levemir 38 u daily. Will see what A1c is. May  increase Actos dosage vs slight increase of Levemir. Counseled on diet and exercise. Refer to podiatry for nail management and decreased sensation/wound monitoring.  ?Chronic, stable. Cont HCTZ 25 mg/d, Norvasc 10 mg/d, valsartan 160 mg/d.  ?F/u in 6 mo for CPE pending above. ?The patient voiced understanding and agreement to the plan. ? ? , DO ?10/18/21 ?7:36 AM ? ?

## 2021-10-18 NOTE — Addendum Note (Signed)
Addended by: Scharlene Gloss B on: 10/18/2021 01:04 PM ? ? Modules accepted: Orders ? ?

## 2021-10-23 NOTE — Telephone Encounter (Signed)
Pt aware and has made appointment.  ?

## 2021-10-30 ENCOUNTER — Other Ambulatory Visit: Payer: Self-pay | Admitting: Family Medicine

## 2021-10-30 ENCOUNTER — Telehealth: Payer: Self-pay

## 2021-10-30 DIAGNOSIS — E1169 Type 2 diabetes mellitus with other specified complication: Secondary | ICD-10-CM

## 2021-10-30 MED ORDER — PIOGLITAZONE HCL 30 MG PO TABS: 30.0000 mg | ORAL_TABLET | Freq: Every day | ORAL | 3 refills | Status: DC

## 2021-10-30 NOTE — Telephone Encounter (Signed)
Pt called in states that he was scheduled for [facility], Pt states that he thought he was scheduled for 9:00 am today but he was scheduled for the 3pm  and he lost a whole day of work. Pt would like a new foot doctor. ?

## 2021-10-30 NOTE — Telephone Encounter (Signed)
Referral put in.

## 2021-11-05 ENCOUNTER — Other Ambulatory Visit: Payer: Self-pay | Admitting: Family Medicine

## 2021-11-21 ENCOUNTER — Encounter: Payer: Self-pay | Admitting: Podiatry

## 2021-11-21 ENCOUNTER — Ambulatory Visit: Payer: Medicare Other | Admitting: Podiatry

## 2021-11-21 DIAGNOSIS — E669 Obesity, unspecified: Secondary | ICD-10-CM | POA: Diagnosis not present

## 2021-11-21 DIAGNOSIS — E1169 Type 2 diabetes mellitus with other specified complication: Secondary | ICD-10-CM

## 2021-11-21 NOTE — Progress Notes (Signed)
?  Subjective:  ?Patient ID: Jose Roberts, male    DOB: May 11, 1946,   MRN: [id number] ? ?Chief Complaint  ?Patient presents with  ? Diabetes  ? ? ?76 y.o. male presents for diabetic foot check. Patient relates he is diabetic and last A1c was 7.7. Relates numbness and tingling in his feet and wanted them checked. He is blind.  Denies any other pedal complaints. Denies n/v/f/c.  ? ?Past Medical History:  ?Diagnosis Date  ? Blind   ? Diabetes mellitus   ? Fistula   ? Fournier's gangrene - s/p I&D May 2012 x2 02/19/2011  ? Hemorrhoids   ? Hypertension   ? ? ?Objective:  ?Physical Exam: ?Vascular: DP/PT pulses 2/4 bilateral. CFT <3 seconds. Normal hair growth on digits. No edema.  ?Skin. No lacerations or abrasions bilateral feet. Nails 1-5 bilateral normal in appearance.  ?Musculoskeletal: MMT 5/5 bilateral lower extremities in DF, PF, Inversion and Eversion. Deceased ROM in DF of ankle joint.  ?Neurological: Sensation intact to light touch. Protective sensation diminished.  ? ?Assessment:  ? ?1. Diabetes mellitus type 2 in obese [facility])   ? ? ? ?Plan:  ?Patient was evaluated and treated and all questions answered. ?-Discussed and educated patient on diabetic foot care, especially with  ?regards to the vascular, neurological and musculoskeletal systems.  ?-Stressed the importance of good glycemic control and the detriment of not  ?controlling glucose levels in relation to the foot. ?-Discussed supportive shoes at all times and checking feet regularly.  ?-Answered all patient questions ?-Patient to return  in 6 months for dm foot check.  ?-Patient advised to call the office if any problems or questions arise in the meantime. ? ? ?Louann Sjogren, DPM  ? ? ?

## 2022-01-04 ENCOUNTER — Telehealth: Payer: Self-pay | Admitting: Family Medicine

## 2022-01-04 NOTE — Telephone Encounter (Signed)
Patient calling about [employer] meter that he has, it is not working any longer.  Patient contacted both companies, [employer] and the [employer] company. They are stating that they need another order or something sent in for a new meter?? Also patient states he is blind and is needing one that is a Verbal meter so that way it will read it out to him.   Please advise

## 2022-01-05 MED ORDER — PRODIGY AUTOCODE BLOOD GLUCOSE W/DEVICE KIT: PACK | 0 refills | Status: DC

## 2022-01-05 NOTE — Telephone Encounter (Signed)
Sent in [place] autocode talking meter.

## 2022-05-02 ENCOUNTER — Ambulatory Visit (INDEPENDENT_AMBULATORY_CARE_PROVIDER_SITE_OTHER): Payer: Medicare Other | Admitting: Family Medicine

## 2022-05-02 ENCOUNTER — Encounter: Payer: Self-pay | Admitting: Family Medicine

## 2022-05-02 VITALS — BP 138/60 | HR 90 | Temp 97.8°F | Ht 71.0 in | Wt 266.6 lb

## 2022-05-02 DIAGNOSIS — Z23 Encounter for immunization: Secondary | ICD-10-CM | POA: Diagnosis not present

## 2022-05-02 DIAGNOSIS — E669 Obesity, unspecified: Secondary | ICD-10-CM

## 2022-05-02 DIAGNOSIS — E1169 Type 2 diabetes mellitus with other specified complication: Secondary | ICD-10-CM

## 2022-05-02 DIAGNOSIS — Z0000 Encounter for general adult medical examination without abnormal findings: Secondary | ICD-10-CM

## 2022-05-02 LAB — LIPID PANEL
Cholesterol: 155 mg/dL (ref 0–200)
HDL: 59.5 mg/dL (ref >39.00)
LDL Cholesterol: 81 mg/dL (ref 0–99)
NonHDL: 95.07
Total CHOL/HDL Ratio: 3
Triglycerides: 72 mg/dL (ref 0.0–149.0)
VLDL: 14.4 mg/dL (ref 0.0–40.0)

## 2022-05-02 LAB — COMPREHENSIVE METABOLIC PANEL
ALT: 13 U/L (ref 0–53)
AST: 13 U/L (ref 0–37)
Albumin: 4 g/dL (ref 3.5–5.2)
Alkaline Phosphatase: 55 U/L (ref 39–117)
BUN: 17 mg/dL (ref 6–23)
CO2: 28 mEq/L (ref 19–32)
Calcium: 10.1 mg/dL (ref 8.4–10.5)
Chloride: 100 mEq/L (ref 96–112)
Creatinine, Ser: 1.28 mg/dL (ref 0.40–1.50)
GFR: 55.01 mL/min — ABNORMAL LOW (ref >60.00)
Glucose, Bld: 127 mg/dL — ABNORMAL HIGH (ref 70–99)
Potassium: 3.7 mEq/L (ref 3.5–5.1)
Sodium: 138 mEq/L (ref 135–145)
Total Bilirubin: 0.9 mg/dL (ref 0.2–1.2)
Total Protein: 7.2 g/dL (ref 6.0–8.3)

## 2022-05-02 LAB — MICROALBUMIN / CREATININE URINE RATIO
Creatinine,U: 224.4 mg/dL
Microalb Creat Ratio: 0.8 mg/g (ref 0.0–30.0)
Microalb, Ur: 1.8 mg/dL (ref 0.0–1.9)

## 2022-05-02 LAB — CBC
HCT: 44.3 % (ref 39.0–52.0)
Hemoglobin: 14.5 g/dL (ref 13.0–17.0)
MCHC: 32.8 g/dL (ref 30.0–36.0)
MCV: 84.7 fl (ref 78.0–100.0)
Platelets: 232 10*3/uL (ref 150.0–400.0)
RBC: 5.23 Mil/uL (ref 4.22–5.81)
RDW: 14.8 % (ref 11.5–15.5)
WBC: 7.1 10*3/uL (ref 4.0–10.5)

## 2022-05-02 LAB — HEMOGLOBIN A1C: Hgb A1c MFr Bld: 7.6 % — ABNORMAL HIGH (ref 4.6–6.5)

## 2022-05-02 MED ORDER — AMLODIPINE BESYLATE 10 MG PO TABS: 10.0000 mg | ORAL_TABLET | Freq: Every day | ORAL | 1 refills | Status: DC

## 2022-05-02 MED ORDER — PRAVASTATIN SODIUM 40 MG PO TABS: 40.0000 mg | ORAL_TABLET | Freq: Every day | ORAL | 1 refills | Status: DC

## 2022-05-02 MED ORDER — HYDROCHLOROTHIAZIDE 25 MG PO TABS: 25.0000 mg | ORAL_TABLET | Freq: Every day | ORAL | 1 refills | Status: DC

## 2022-05-02 MED ORDER — METFORMIN HCL ER 500 MG PO TB24: ORAL_TABLET | ORAL | 2 refills | Status: DC

## 2022-05-02 MED ORDER — PIOGLITAZONE HCL 30 MG PO TABS: 30.0000 mg | ORAL_TABLET | Freq: Every day | ORAL | 1 refills | Status: DC

## 2022-05-02 MED ORDER — VALSARTAN 160 MG PO TABS: 160.0000 mg | ORAL_TABLET | Freq: Every day | ORAL | 1 refills | Status: DC

## 2022-05-02 NOTE — Patient Instructions (Signed)
Give [place] 2-3 business days to get the results of your labs back.   Keep the diet clean and stay active.  Please get me a copy of your advanced directive form at your convenience.   The Shingrix vaccine (for shingles) is a 2 shot series spaced 2-6 months apart. It can make people feel low energy, achy and almost like they have the flu for 48 hours after injection. 1/5 people can have nausea and/or vomiting. Please plan accordingly when deciding on when to get this shot. Call the pharmacy to get this. The second shot of the series is less severe regarding the side effects, but it still lasts 48 hours.   Please also contact the pharmacy regarding your tetanus booster shot. Medicare will not cover that shot here unfortunately.  Let us know if you need anything.

## 2022-05-02 NOTE — Progress Notes (Signed)
Chief Complaint  Patient presents with   Annual Exam    CPE- fasting     Well Male Jose Roberts is here for a complete physical.   His last physical was >1 year ago.  Current diet: in general, diet is fair.   Current exercise: walking Weight trend: stable Fatigue out of ordinary? No. Seat belt? Yes.   Advanced directive? No  Health maintenance Shingrix- No Colonoscopy- Yes Tetanus- Due Hep C- Yes Pneumonia vaccine- Yes  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 02/19/2011   Hemorrhoids    Hypertension      Past Surgical History:  Procedure Laterality Date   EYE SURGERY     HEMORRHOID SURGERY     revision   RETINAL DETACHMENT SURGERY     surgery on gangrene  May 6/7/8, 2012 (x 3 debridements)   Fournier's Gangrene of perineum    Medications  Current Outpatient Medications on File Prior to Visit  Medication Sig Dispense Refill   amLODipine (NORVASC) 10 MG tablet Take 1 tablet by mouth once daily 90 tablet 1   glucose blood (PRODIGY NO CODING BLOOD GLUC) test strip USE TO CHECK BLOOD SUGAR 3  TIMES DAILY 300 strip 3   hydrochlorothiazide (HYDRODIURIL) 25 MG tablet Take 1 tablet by mouth once daily 90 tablet 1   insulin detemir (LEVEMIR FLEXTOUCH) 100 UNIT/ML FlexPen Inject 38 Units into the skin daily. 45 mL 3   Insulin Pen Needle (PEN NEEDLES) 32G X 4 MM MISC Inject 35 Units as directed daily. To use with levemir 90 each 0   metFORMIN (GLUCOPHAGE-XR) 500 MG 24 hr tablet TAKE 2 TABLETS BY MOUTH TWICE DAILY WITH BREAKFAST AND SUPPER 360 tablet 2   Multiple Vitamin (MULTIVITAMIN WITH MINERALS) TABS Take 1 tablet by mouth daily.     pioglitazone (ACTOS) 30 MG tablet Take 1 tablet (30 mg total) by mouth daily. 90 tablet 3   pravastatin (PRAVACHOL) 40 MG tablet Take 1 tablet (40 mg total) by mouth daily. 90 tablet 3   sildenafil (VIAGRA) 100 MG tablet Take 1 tablet (100 mg total) by mouth  as needed for erectile dysfunction. 30 tablet 2   valsartan (DIOVAN) 160 MG tablet Take 1 tablet by mouth once daily 90 tablet 1   Allergies No Known Allergies  Family History Family History  Problem Relation Age of Onset   Diabetes Mother    Hypertension Mother    Cancer Mother        pancreas   Diabetes Father    Hypertension Father    Diabetes Brother    Diabetes Sister     Review of Systems: Constitutional:  no fevers Eye: Pt is blind Ears:  No changes in hearing Nose/Mouth/Throat:  no complaints of nasal congestion, no sore throat Cardiovascular: no chest pain Respiratory:  No shortness of breath Gastrointestinal:  No change in bowel habits GU:  No frequency Integumentary:  no abnormal skin lesions reported Neurologic:  no headaches Endocrine:  denies unexplained weight changes  Exam BP 138/60   Pulse 90   Temp 97.8 F (36.6 C) (Oral)   Ht 5\' 11"  (1.803 m)   Wt 266 lb 9.6 oz (120.9 kg)   SpO2 98%   BMI 37.18 kg/m  General:  well developed, well nourished, in no apparent distress Skin:  no significant moles, warts, or growths Head:  no  masses, lesions, or tenderness Eyes:  Pupils opacified b/l, EOMi Ears:  canals without lesions, TMs shiny without retraction, no obvious effusion, no erythema Nose:  nares patent, mucosa normal Throat/Pharynx:  lips and gingiva without lesion; tongue and uvula midline; non-inflamed pharynx; no exudates or postnasal drainage Lungs:  clear to auscultation, breath sounds equal bilaterally, no respiratory distress Cardio:  regular rate and rhythm, no LE edema or bruits Rectal: Deferred GI: BS+, S, NT, ND, no masses or organomegaly Musculoskeletal:  symmetrical muscle groups noted without atrophy or deformity Neuro:  gait normal; deep tendon reflexes normal and symmetric Psych: well oriented with normal range of affect and appropriate judgment/insight  Assessment and Plan  Well adult exam  Diabetes mellitus type 2 in obese  ([place]) - Plan: CBC, Comprehensive metabolic panel, Hemoglobin A1c, Lipid panel, Microalbumin / creatinine urine ratio   Well 76 y.o. male. Counseled on diet and exercise. Advanced directive form provided today.  Shingrix and Tdap rec'd, would need to go to pharmacy for that.  Flu shot today.  Other orders as above. Follow up in 6 mo for DM visit or prn.  The patient voiced understanding and agreement to the plan.  [place], DO 05/02/22 7:32 AM

## 2022-05-11 ENCOUNTER — Other Ambulatory Visit: Payer: Self-pay | Admitting: Family Medicine

## 2022-05-21 ENCOUNTER — Other Ambulatory Visit: Payer: Self-pay | Admitting: Family Medicine

## 2022-05-21 DIAGNOSIS — E1169 Type 2 diabetes mellitus with other specified complication: Secondary | ICD-10-CM

## 2022-05-21 MED ORDER — PRODIGY NO CODING BLOOD GLUC VI STRP: ORAL_STRIP | 3 refills | Status: DC

## 2022-06-18 ENCOUNTER — Encounter: Payer: Self-pay | Admitting: Family Medicine

## 2022-06-19 ENCOUNTER — Telehealth: Payer: Medicare Other | Admitting: Medical

## 2022-06-19 ENCOUNTER — Telehealth: Payer: Self-pay | Admitting: Family Medicine

## 2022-06-19 NOTE — Telephone Encounter (Signed)
Patient is blind. I will not make this recommendation to him.

## 2022-06-19 NOTE — Telephone Encounter (Signed)
l °

## 2022-06-19 NOTE — Telephone Encounter (Signed)
[employer] rep called recommending pt get his eyes checked as they did not have a record of the last time it was done. Please Advise.

## 2022-06-20 ENCOUNTER — Telehealth: Payer: Self-pay

## 2022-06-20 NOTE — Progress Notes (Signed)
[facility])                                            [facility] Quality Pharmacy Team                                        Statin Quality Measure Assessment    06/20/2022  Jose Roberts Dec 29, 1945 [id number]  Per review of chart and payor information, this patient has been flagged for non-adherence to the following CMS Quality Measure:   [x]  Statin Use in Persons with Diabetes  []  Statin Use in Persons with Cardiovascular Disease  The 10-year ASCVD risk score (Arnett DK, et al., 2019) is: 37.3%   Values used to calculate the score:     Age: 76 years     Sex: Male     Is Non-Hispanic African American: Yes     Diabetic: Yes     Tobacco smoker: No     Systolic Blood Pressure: 138 mmHg     Is BP treated: Yes     HDL Cholesterol: 59.5 mg/dL     Total Cholesterol: 155 mg/dL  Patient was outreached today regarding pravastatin prescription. Per discussion with patient, he takes medication every other day as he doesn't feel like he needs to be on medication. Education was provided regarding reason for pravastatin and importance of compliance. No appointment remaining in 2023 with PCP. If clinically appropriate, discussion of medication compliance at the next office visit.  Please consider ONE of the following recommendations:     Initiate high intensity statin Atorvastatin 40mg  once daily, #90, 3 refills   Rosuvastatin 20mg  once daily, #90, 3 refills    Initiate moderate intensity          statin with reduced frequency if prior          statin intolerance 1x weekly, #13, 3 refills   2x weekly, #26, 3 refills   3x weekly, #39, 3 refills   Code for past statin intolerance or other exclusions (required annually)  Drug Induced Myopathy G72.0   Myositis, unspecified M60.9   Rhabdomyolysis M62.82   Myalgia - for [facility] ONLY  & will not close SUPD measure M79.1   Cirrhosis of liver K74.69   Biliary cirrhosis, unspecified K74.5    Abnormal blood glucose - for SUPD ONLY R73.09   Prediabetes - for SUPD ONLY  R73.03   Polycystic ovarian syndrome E28.2   Adverse effect of antihyperlipidemic and antiarteriosclerotic drugs, initial encounter 2024       Thank you for your time,  , PharmD Clinical Pharmacist [facility] Cell: [phone]

## 2022-07-12 ENCOUNTER — Telehealth: Payer: Self-pay | Admitting: Pharmacist

## 2022-07-12 MED ORDER — PIOGLITAZONE HCL 30 MG PO TABS: 30.0000 mg | ORAL_TABLET | Freq: Every day | ORAL | 1 refills | Status: DC

## 2022-07-12 MED ORDER — AMLODIPINE BESYLATE 10 MG PO TABS: 10.0000 mg | ORAL_TABLET | Freq: Every day | ORAL | 1 refills | Status: DC

## 2022-07-12 MED ORDER — LEVEMIR FLEXTOUCH 100 UNIT/ML ~~LOC~~ SOPN: 38.0000 [IU] | PEN_INJECTOR | Freq: Every day | SUBCUTANEOUS | 4 refills | Status: DC

## 2022-07-12 MED ORDER — METFORMIN HCL ER 500 MG PO TB24: ORAL_TABLET | ORAL | 0 refills | Status: DC

## 2022-07-12 MED ORDER — VALSARTAN 160 MG PO TABS: 160.0000 mg | ORAL_TABLET | Freq: Every day | ORAL | 1 refills | Status: DC

## 2022-07-12 MED ORDER — HYDROCHLOROTHIAZIDE 25 MG PO TABS: 25.0000 mg | ORAL_TABLET | Freq: Every day | ORAL | 1 refills | Status: DC

## 2022-07-12 MED ORDER — PRAVASTATIN SODIUM 40 MG PO TABS: 40.0000 mg | ORAL_TABLET | Freq: Every day | ORAL | 1 refills | Status: DC

## 2022-07-12 NOTE — Telephone Encounter (Signed)
Patient was showing on list as not taking statin. He has type 2 DM and pravastatin 40mg  is on his med list. It was filled according to [facility] records 02/02/2022 for 90 day at [facility]. New prescription was sent to [facility] 04/2022 but according to tech with [facility] patient has not requested refill yet. This is also the case for valsartan.   Spoke with patient and he states he has been having trouble getting some medications from [facility]. He thought cost would be less but it has not been.  He will start new insurance in 2024 and he anticipates most medications will be zero copay except Levemir ($35). He is requesting to have all his medications changed back to [facility].  Will check with PCP to make sure Ok to send Rx for 6 months (next OV with PCP is end of March 2024) to [facility].

## 2022-07-12 NOTE — Telephone Encounter (Signed)
With provider and patient permission sent prescriptions for maintenance medications to [facility] - 90 DS with 1 RF.

## 2022-07-20 NOTE — Telephone Encounter (Signed)
Patient has not picked up medications from [facility] yet. They have 90 days of pravastatin, amlodipine, hydrochlorothiazide, valsartan and pioglitazone ready for him.  LM on VM reminding patient about prescriptions (total cost is $60.57). He can back down amount to 30 day supply if cost is an issue.

## 2022-07-24 ENCOUNTER — Other Ambulatory Visit: Payer: Self-pay | Admitting: Family Medicine

## 2022-08-10 ENCOUNTER — Telehealth: Payer: Self-pay

## 2022-08-10 NOTE — Progress Notes (Signed)
[facility])                                            [facility] Quality Pharmacy Team                                        Statin Quality Measure Assessment    08/10/2022  Jose Roberts 12-02-1945 [id number]  Per review of chart and payor information, this patient has been flagged for non-adherence to the following CMS Quality Measure:   [x]  Statin Use in Persons with Diabetes  []  Statin Use in Persons with Cardiovascular Disease  Patient was outreached today regarding pravastatin prescription. Per discussion with patient, he is now taking pravastatin daily as prescribed given the last discussion we had. He plans to hold off refilling medication until next week when his new insurance copay of $0 starts for medication.    Thank you for your time,  , PharmD Clinical Pharmacist [facility] Cell: [phone]

## 2022-09-08 ENCOUNTER — Emergency Department (HOSPITAL_COMMUNITY): Payer: Medicare Other

## 2022-09-08 ENCOUNTER — Observation Stay (HOSPITAL_COMMUNITY)
Admission: EM | Admit: 2022-09-08 | Discharge: 2022-09-08 | Disposition: A | Discharge status: Home / Self Care | Payer: Medicare Other | Attending: Family Medicine | Admitting: Family Medicine

## 2022-09-08 ENCOUNTER — Encounter (HOSPITAL_COMMUNITY): Payer: Self-pay

## 2022-09-08 DIAGNOSIS — I10 Essential (primary) hypertension: Secondary | ICD-10-CM | POA: Diagnosis present

## 2022-09-08 DIAGNOSIS — Z79899 Other long term (current) drug therapy: Secondary | ICD-10-CM | POA: Insufficient documentation

## 2022-09-08 DIAGNOSIS — I700 Atherosclerosis of aorta: Secondary | ICD-10-CM | POA: Diagnosis not present

## 2022-09-08 DIAGNOSIS — K625 Hemorrhage of anus and rectum: Secondary | ICD-10-CM | POA: Diagnosis present

## 2022-09-08 DIAGNOSIS — E785 Hyperlipidemia, unspecified: Secondary | ICD-10-CM | POA: Diagnosis present

## 2022-09-08 DIAGNOSIS — U071 COVID-19: Secondary | ICD-10-CM | POA: Insufficient documentation

## 2022-09-08 DIAGNOSIS — R109 Unspecified abdominal pain: Secondary | ICD-10-CM | POA: Diagnosis not present

## 2022-09-08 DIAGNOSIS — Z794 Long term (current) use of insulin: Secondary | ICD-10-CM | POA: Diagnosis not present

## 2022-09-08 DIAGNOSIS — E1169 Type 2 diabetes mellitus with other specified complication: Secondary | ICD-10-CM | POA: Diagnosis not present

## 2022-09-08 DIAGNOSIS — E669 Obesity, unspecified: Secondary | ICD-10-CM | POA: Diagnosis present

## 2022-09-08 DIAGNOSIS — K611 Rectal abscess: Principal | ICD-10-CM | POA: Diagnosis present

## 2022-09-08 DIAGNOSIS — Z7984 Long term (current) use of oral hypoglycemic drugs: Secondary | ICD-10-CM | POA: Insufficient documentation

## 2022-09-08 LAB — RESP PANEL BY RT-PCR (RSV, FLU A&B, COVID)  RVPGX2
Influenza A by PCR: NEGATIVE
Influenza B by PCR: NEGATIVE
Resp Syncytial Virus by PCR: NEGATIVE
SARS Coronavirus 2 by RT PCR: POSITIVE — AB

## 2022-09-08 LAB — URINALYSIS, ROUTINE W REFLEX MICROSCOPIC
Bilirubin Urine: NEGATIVE
Glucose, UA: NEGATIVE mg/dL
Hgb urine dipstick: NEGATIVE
Ketones, ur: NEGATIVE mg/dL
Leukocytes,Ua: NEGATIVE
Nitrite: NEGATIVE
Protein, ur: NEGATIVE mg/dL
Specific Gravity, Urine: 1.019 (ref 1.005–1.030)
pH: 5 (ref 5.0–8.0)

## 2022-09-08 LAB — COMPREHENSIVE METABOLIC PANEL
ALT: 14 U/L (ref 0–44)
AST: 15 U/L (ref 15–41)
Albumin: 3.5 g/dL (ref 3.5–5.0)
Alkaline Phosphatase: 46 U/L (ref 38–126)
Anion gap: 10 (ref 5–15)
BUN: 13 mg/dL (ref 8–23)
CO2: 25 mmol/L (ref 22–32)
Calcium: 9.3 mg/dL (ref 8.9–10.3)
Chloride: 101 mmol/L (ref 98–111)
Creatinine, Ser: 1.2 mg/dL (ref 0.61–1.24)
GFR, Estimated: >60 mL/min (ref >60)
Glucose, Bld: 190 mg/dL — ABNORMAL HIGH (ref 70–99)
Potassium: 3.5 mmol/L (ref 3.5–5.1)
Sodium: 136 mmol/L (ref 135–145)
Total Bilirubin: 1 mg/dL (ref 0.3–1.2)
Total Protein: 7 g/dL (ref 6.5–8.1)

## 2022-09-08 LAB — CBC WITH DIFFERENTIAL/PLATELET
Abs Immature Granulocytes: 0.08 10*3/uL — ABNORMAL HIGH (ref 0.00–0.07)
Basophils Absolute: 0 10*3/uL (ref 0.0–0.1)
Basophils Relative: 0 %
Eosinophils Absolute: 0.1 10*3/uL (ref 0.0–0.5)
Eosinophils Relative: 0 %
HCT: 44.6 % (ref 39.0–52.0)
Hemoglobin: 14.4 g/dL (ref 13.0–17.0)
Immature Granulocytes: 1 %
Lymphocytes Relative: 11 %
Lymphs Abs: 1.6 10*3/uL (ref 0.7–4.0)
MCH: 27.3 pg (ref 26.0–34.0)
MCHC: 32.3 g/dL (ref 30.0–36.0)
MCV: 84.5 fL (ref 80.0–100.0)
Monocytes Absolute: 1.3 10*3/uL — ABNORMAL HIGH (ref 0.1–1.0)
Monocytes Relative: 9 %
Neutro Abs: 10.9 10*3/uL — ABNORMAL HIGH (ref 1.7–7.7)
Neutrophils Relative %: 79 %
Platelets: 214 10*3/uL (ref 150–400)
RBC: 5.28 MIL/uL (ref 4.22–5.81)
RDW: 14.3 % (ref 11.5–15.5)
WBC: 13.8 10*3/uL — ABNORMAL HIGH (ref 4.0–10.5)
nRBC: 0 % (ref 0.0–0.2)

## 2022-09-08 LAB — POC OCCULT BLOOD, ED: Fecal Occult Bld: NEGATIVE

## 2022-09-08 LAB — LIPASE, BLOOD: Lipase: 26 U/L (ref 11–51)

## 2022-09-08 MED ORDER — NIRMATRELVIR/RITONAVIR (PAXLOVID)TABLET: 3.0000 | ORAL_TABLET | Freq: Two times a day (BID) | ORAL | 0 refills | Status: AC

## 2022-09-08 MED ORDER — IOHEXOL 300 MG/ML  SOLN
100.0000 mL | Freq: Once | INTRAMUSCULAR | Status: AC | PRN
  Administered 2022-09-08: 100 mL via INTRAVENOUS

## 2022-09-08 MED ORDER — INSULIN ASPART 100 UNIT/ML IJ SOLN
0.0000 [IU] | Freq: Three times a day (TID) | INTRAMUSCULAR | Status: DC
  Filled 2022-09-08: qty 0.15

## 2022-09-08 MED ORDER — PIPERACILLIN-TAZOBACTAM 3.375 G IVPB 30 MIN
3.3750 g | Freq: Once | INTRAVENOUS | Status: AC
  Administered 2022-09-08: 3.375 g via INTRAVENOUS
  Filled 2022-09-08: qty 50

## 2022-09-08 MED ORDER — LIDOCAINE-EPINEPHRINE (PF) 2 %-1:200000 IJ SOLN
INTRAMUSCULAR | Status: AC
  Filled 2022-09-08: qty 20

## 2022-09-08 MED ORDER — HYDROCODONE-ACETAMINOPHEN 5-325 MG PO TABS
1.0000 | ORAL_TABLET | ORAL | Status: DC | PRN
  Administered 2022-09-08: 1 via ORAL
  Filled 2022-09-08: qty 1

## 2022-09-08 MED ORDER — TRAMADOL HCL 50 MG PO TABS: 50.0000 mg | ORAL_TABLET | Freq: Three times a day (TID) | ORAL | 0 refills | Status: DC | PRN

## 2022-09-08 MED ORDER — ACETAMINOPHEN 325 MG PO TABS: 650.0000 mg | ORAL_TABLET | Freq: Four times a day (QID) | ORAL | Status: DC | PRN

## 2022-09-08 MED ORDER — AMOXICILLIN-POT CLAVULANATE 875-125 MG PO TABS: 1.0000 | ORAL_TABLET | Freq: Two times a day (BID) | ORAL | 0 refills | Status: AC

## 2022-09-08 NOTE — ED Triage Notes (Signed)
Pt arrived via POV, c/o pain near rectum, states he has a hx of hemorrhoids that progress and he believes this is the same.

## 2022-09-08 NOTE — Discharge Summary (Addendum)
Physician Discharge Summary   Patient: Jose Roberts MRN: [id number] DOB: 04/16/1946  Admit date:     09/08/2022  Discharge date: 09/08/22  Discharge Physician: Orene Desanctis   PCP: Shelda Pal, DO   Recommendations at discharge:    Perirectal abscess - complete antibiotic course with PRN pain control. Follow up with PCP in a week.  COVID-19-complete course of Paxlovid  Discharge Diagnoses: Principal Problem:   Perirectal abscess Active Problems:   Diabetes mellitus type 2 in obese [facility])   Essential hypertension   Hyperlipidemia    Hospital Course: Jose Roberts is a 77 y.o. male with medical history significant of HTN, HLD, insulin dependent T2DM, blindness, remote hx of Fournier's gangrene who presents with concerns of rectal pain.   Pt felt a hard nodule and rectal pain about 4 days ago. Has been doing sitz bath daily. He has remote hx of perianal abscess with resultant Fournier's gangrene. Denies any chills or fever. No nausea, vomiting.    In the ED, afebrile, leukocytosis of 13.8. CMP unremarkable.    CT A/P with contrast shows irght perianal abscess around 3.2cm.  EDP consulted general surgery Dr. Rich Number who performed bedside I&D and pt was discharged home in stable condition with oral antibiotics and short course of pain control.  His COVID PCR was incidentally found to be positive but he was asymptomatic. He was still prescribed course of Paxlovid given he is high risk with his age and comorbidities.   Assessment and Plan:  Peri-rectal abscess -CT A/P with contrast shows right perianal abscess around 3.2cm. Appears to span the right peri-anal/gluteal cleft on exam.  General surgery was able to perform I&D at bedside and he was discharged with Augmentin   COVID-19 infection Asymptomatic. He was prescribed course of Paxlovid given he is high risk with his age and comorbidities.   T2DM -continue home insulin and oral regimen  HTN -continue home  antihypertensive  HLD -continue statin      Consultants: General surgery Procedures performed: I&D  Disposition: Home Diet recommendation:  Cardiac diet DISCHARGE MEDICATION: Allergies as of 09/08/2022   No Known Allergies      Medication List     TAKE these medications    amLODipine 10 MG tablet Commonly known as: NORVASC Take 1 tablet (10 mg total) by mouth daily.   amoxicillin-clavulanate 875-125 MG tablet Commonly known as: AUGMENTIN Take 1 tablet by mouth every 12 (twelve) hours for 14 doses.   hydrochlorothiazide 25 MG tablet Commonly known as: HYDRODIURIL Take 1 tablet (25 mg total) by mouth daily.   Levemir FlexPen 100 UNIT/ML FlexPen Generic drug: insulin detemir INJECT SUBCUTANEOUSLY 38 UNITS  DAILY   metFORMIN 500 MG 24 hr tablet Commonly known as: GLUCOPHAGE-XR TAKE 2 TABLETS BY MOUTH TWICE DAILY WITH BREAKFAST AND WITH SUPPER   multivitamin with minerals Tabs tablet Take 1 tablet by mouth daily.   nirmatrelvir/ritonavir 20 x 150 MG & 10 x 100MG  Tabs Commonly known as: PAXLOVID Take 3 tablets by mouth 2 (two) times daily for 5 days. Patient GFR is 60. Take nirmatrelvir (150 mg) two tablets twice daily for 5 days and ritonavir (100 mg) one tablet twice daily for 5 days.   Pen Needles 32G X 4 MM Misc Inject 35 Units as directed daily. To use with levemir   pioglitazone 30 MG tablet Commonly known as: ACTOS Take 1 tablet (30 mg total) by mouth daily.   pravastatin 40 MG tablet Commonly known as: PRAVACHOL Take 1  tablet (40 mg total) by mouth daily.   Prodigy No Coding Blood Gluc test strip Generic drug: glucose blood USE TO CHECK BLOOD SUGAR 3  TIMES DAILY   sildenafil 100 MG tablet Commonly known as: VIAGRA Take 1 tablet (100 mg total) by mouth as needed for erectile dysfunction.   traMADol 50 MG tablet Commonly known as: ULTRAM Take 1 tablet (50 mg total) by mouth every 8 (eight) hours as needed.   valsartan 160 MG tablet Commonly  known as: DIOVAN Take 1 tablet (160 mg total) by mouth daily.        Follow-up Information     [facility] Follow up in 3 week(s).   Specialty: General Surgery Contact information: [address] [place] [address] [phone]               Discharge Exam: Temp of 98.55F, BP 169/62 HR 97 Constitutional: NAD, calm, comfortable, well appearing elderly male laying flat in bed Eyes: left eye remains closed with normal lid. Right eye partially open unable to visualize pupils. Has chronic blindness. ENMT: Mucous membranes are moist.  Neck: normal, supple Respiratory: clear to auscultation bilaterally, no wheezing, no crackles. Normal respiratory effort. No accessory muscle use.  Cardiovascular: Regular rate and rhythm, no murmurs / rubs / gallops. No extremity edema.   Abdomen: no tenderness,. Bowel sounds positive.  Rectal: large indurated right per-anal edematous nodule with purulent discharge Musculoskeletal: no clubbing / cyanosis. No joint deformity upper and lower extremities.  Normal muscle tone.  Skin: see rectal exam above Neurologic: CN 2-12 grossly intact. Strength 5/5 in all 4.  Psychiatric: Normal judgment and insight. Alert and oriented x 3. Normal mood.  Condition at discharge: good  The results of significant diagnostics from this hospitalization (including imaging, microbiology, ancillary and laboratory) are listed below for reference.   Imaging Studies: CT Abdomen Pelvis W Contrast  Result Date: 09/08/2022 CLINICAL DATA:  Rectal pain. Clinical suspicion for perirectal abscess. EXAM: CT ABDOMEN AND PELVIS WITH CONTRAST TECHNIQUE: Multidetector CT imaging of the abdomen and pelvis was performed using the standard protocol following bolus administration of intravenous contrast. RADIATION DOSE REDUCTION: This exam was performed according to the departmental dose-optimization program which includes automated  exposure control, adjustment of the mA and/or kV according to patient size and/or use of iterative reconstruction technique. CONTRAST:  OMNIPAQUE IOHEXOL 300 MG/ML  SOLN COMPARISON:  01/13/2020 FINDINGS: Lower Chest: No acute findings. Hepatobiliary: No hepatic masses identified. Gallbladder is unremarkable. No evidence of biliary ductal dilatation. Pancreas:  No mass or inflammatory changes. Spleen: Within normal limits in size and appearance. Adrenals/Urinary Tract: Stable small benign right adrenal adenoma (no followup imaging is recommended). No suspicious masses identified. No evidence of ureteral calculi or hydronephrosis. Diffuse bladder wall thickening is seen, likely due to chronic bladder outlet obstruction given enlarged prostate. Stomach/Bowel: Small hiatal hernia again noted. No evidence of obstruction. Normal appendix visualized. Diverticulosis is seen mainly involving the descending and sigmoid colon, however there is no evidence of diverticulitis. Rim enhancing fluid collection is seen in the right lateral wall of the anus measuring 3.4 x 1.7 cm, increased in size from 2.1 x 1.2 cm previously. This is consistent with a perianal abscess. Vascular/Lymphatic: No pathologically enlarged lymph nodes. No acute vascular findings. Aortic atherosclerotic calcification incidentally noted. Reproductive: Moderately enlarged prostate. No mass or other significant abnormality. Other:  None. Musculoskeletal:  No suspicious bone lesions identified. IMPRESSION: Increased size of 3.4 cm right perianal  abscess. Colonic diverticulosis, without radiographic evidence of diverticulitis. Stable small hiatal hernia. Moderately enlarged prostate, and findings of chronic bladder outlet obstruction. Aortic Atherosclerosis (ICD10-I70.0). Electronically Signed   By: Marlaine Hind M.D.   On: 09/08/2022 17:45    Microbiology: Results for orders placed or performed during the hospital encounter of 09/08/22  Resp panel by  RT-PCR (RSV, Flu A&B, Covid) Anterior Nasal Swab     Status: Abnormal   Collection Time: 09/08/22  6:06 PM   Specimen: Anterior Nasal Swab  Result Value Ref Range Status   SARS Coronavirus 2 by RT PCR POSITIVE (A) NEGATIVE Final    Comment: (NOTE) SARS-CoV-2 target nucleic acids are DETECTED.  The SARS-CoV-2 RNA is generally detectable in upper respiratory specimens during the acute phase of infection. Positive results are indicative of the presence of the identified virus, but do not rule out bacterial infection or co-infection with other pathogens not detected by the test. Clinical correlation with patient history and other diagnostic information is necessary to determine patient infection status. The expected result is Negative.  Fact Sheet for Patients: EntrepreneurPulse.com.au  Fact Sheet for Healthcare Providers: IncredibleEmployment.be  This test is not yet approved or cleared by the [place] FDA and  has been authorized for detection and/or diagnosis of SARS-CoV-2 by FDA under an Emergency Use Authorization (EUA).  This EUA will remain in effect (meaning this test can be used) for the duration of  the COVID-19 declaration under Section 564(b)(1) of the A ct, 21 U.S.C. section 360bbb-3(b)(1), unless the authorization is terminated or revoked sooner.     Influenza A by PCR NEGATIVE NEGATIVE Final   Influenza B by PCR NEGATIVE NEGATIVE Final    Comment: (NOTE) The Xpert Xpress SARS-CoV-2/FLU/RSV plus assay is intended as an aid in the diagnosis of influenza from Nasopharyngeal swab specimens and should not be used as a sole basis for treatment. Nasal washings and aspirates are unacceptable for Xpert Xpress SARS-CoV-2/FLU/RSV testing.  Fact Sheet for Patients: EntrepreneurPulse.com.au  Fact Sheet for Healthcare Providers: IncredibleEmployment.be  This test is not yet approved or cleared by the  [place] FDA and has been authorized for detection and/or diagnosis of SARS-CoV-2 by FDA under an Emergency Use Authorization (EUA). This EUA will remain in effect (meaning this test can be used) for the duration of the COVID-19 declaration under Section 564(b)(1) of the Act, 21 U.S.C. section 360bbb-3(b)(1), unless the authorization is terminated or revoked.     Resp Syncytial Virus by PCR NEGATIVE NEGATIVE Final    Comment: (NOTE) Fact Sheet for Patients: EntrepreneurPulse.com.au  Fact Sheet for Healthcare Providers: IncredibleEmployment.be  This test is not yet approved or cleared by the [place] FDA and has been authorized for detection and/or diagnosis of SARS-CoV-2 by FDA under an Emergency Use Authorization (EUA). This EUA will remain in effect (meaning this test can be used) for the duration of the COVID-19 declaration under Section 564(b)(1) of the Act, 21 U.S.C. section 360bbb-3(b)(1), unless the authorization is terminated or revoked.  Performed at [facility], [place] [address] [place], [place] [address]     Labs: CBC: Recent Labs  Lab 09/08/22 0940  WBC 13.8*  NEUTROABS 10.9*  HGB 14.4  HCT 44.6  MCV 84.5  PLT 258   Basic Metabolic Panel: Recent Labs  Lab 09/08/22 0940  NA 136  K 3.5  CL 101  CO2 25  GLUCOSE 190*  BUN 13  CREATININE 1.20  CALCIUM 9.3   Liver Function Tests: Recent  Labs  Lab 09/08/22 0940  AST 15  ALT 14  ALKPHOS 46  BILITOT 1.0  PROT 7.0  ALBUMIN 3.5   CBG: No results for input(s): "GLUCAP" in the last 168 hours.  Discharge time spent: less than 30 minutes.  Signed: Anselm Jungling, DO [facility] 09/08/2022

## 2022-09-08 NOTE — Consult Note (Signed)
Reason for Consult:abscess Referring Provider: [name] is an 77 y.o. male.  HPI: 77 yo male with 2 days of perianal pain and swelling. He had a similar infection 10 years ago that was fournier's. He denies bleeding  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 02/19/2011   Hemorrhoids    Hypertension     Past Surgical History:  Procedure Laterality Date   EYE SURGERY     HEMORRHOID SURGERY     revision   RETINAL DETACHMENT SURGERY     surgery on gangrene  May 6/7/8, 2012 (x 3 debridements)   Fournier's Gangrene of perineum    Family History  Problem Relation Age of Onset   Diabetes Mother    Hypertension Mother    Cancer Mother        pancreas   Diabetes Father    Hypertension Father    Diabetes Brother    Diabetes Sister     Social History:  reports that he has never smoked. He has never used smokeless tobacco. He reports current alcohol use of about 12.0 standard drinks of alcohol per week. He reports that he does not use drugs.  Allergies: No Known Allergies  Medications: I have reviewed the patient's current medications.  Results for orders placed or performed during the hospital encounter of 09/08/22 (from the past 48 hour(s))  CBC with Differential     Status: Abnormal   Collection Time: 09/08/22  9:40 AM  Result Value Ref Range   WBC 13.8 (H) 4.0 - 10.5 K/uL   RBC 5.28 4.22 - 5.81 MIL/uL   Hemoglobin 14.4 13.0 - 17.0 g/dL   HCT 01.0 93.2 - 35.5 %   MCV 84.5 80.0 - 100.0 fL   MCH 27.3 26.0 - 34.0 pg   MCHC 32.3 30.0 - 36.0 g/dL   RDW 73.2 20.2 - 54.2 %   Platelets 214 150 - 400 K/uL   nRBC 0.0 0.0 - 0.2 %   Neutrophils Relative % 79 %   Neutro Abs 10.9 (H) 1.7 - 7.7 K/uL   Lymphocytes Relative 11 %   Lymphs Abs 1.6 0.7 - 4.0 K/uL   Monocytes Relative 9 %   Monocytes Absolute 1.3 (H) 0.1 - 1.0 K/uL   Eosinophils Relative 0 %   Eosinophils Absolute  0.1 0.0 - 0.5 K/uL   Basophils Relative 0 %   Basophils Absolute 0.0 0.0 - 0.1 K/uL   Immature Granulocytes 1 %   Abs Immature Granulocytes 0.08 (H) 0.00 - 0.07 K/uL    Comment: Performed at [facility], [address] [place], [place] [address]  Comprehensive metabolic panel     Status: Abnormal   Collection Time: 09/08/22  9:40 AM  Result Value Ref Range   Sodium 136 135 - 145 mmol/L   Potassium 3.5 3.5 - 5.1 mmol/L   Chloride 101 98 - 111 mmol/L   CO2 25 22 - 32 mmol/L   Glucose, Bld 190 (H) 70 - 99 mg/dL    Comment: Glucose reference range applies only to samples taken after fasting for at least 8 hours.   BUN 13 8 - 23 mg/dL   Creatinine, Ser 7.62 0.61 - 1.24 mg/dL   Calcium 9.3 8.9 - 83.1 mg/dL   Total Protein 7.0 6.5 - 8.1 g/dL   Albumin 3.5 3.5 - 5.0  g/dL   AST 15 15 - 41 U/L   ALT 14 0 - 44 U/L   Alkaline Phosphatase 46 38 - 126 U/L   Total Bilirubin 1.0 0.3 - 1.2 mg/dL   GFR, Estimated >60 >60 mL/min    Comment: (NOTE) Calculated using the CKD-EPI Creatinine Equation (2021)    Anion gap 10 5 - 15    Comment: Performed at [facility], [place] [address] [place], [place] [address]  Lipase, blood     Status: None   Collection Time: 09/08/22  9:40 AM  Result Value Ref Range   Lipase 26 11 - 51 U/L    Comment: Performed at [facility], [place] [address], [place], [place] [address]  Urinalysis, Routine w reflex microscopic -Urine, Clean Catch     Status: None   Collection Time: 09/08/22  9:54 AM  Result Value Ref Range   Color, Urine YELLOW YELLOW   APPearance CLEAR CLEAR   Specific Gravity, Urine 1.019 1.005 - 1.030   pH 5.0 5.0 - 8.0   Glucose, UA NEGATIVE NEGATIVE mg/dL   Hgb urine dipstick NEGATIVE NEGATIVE   Bilirubin Urine NEGATIVE NEGATIVE   Ketones, ur NEGATIVE NEGATIVE mg/dL   Protein, ur NEGATIVE NEGATIVE mg/dL   Nitrite NEGATIVE NEGATIVE   Leukocytes,Ua NEGATIVE NEGATIVE    Comment: Performed at  [facility], [place] [address] [place],  [address]  POC occult blood, ED     Status: None   Collection Time: 09/08/22  3:59 PM  Result Value Ref Range   Fecal Occult Bld NEGATIVE NEGATIVE    CT Abdomen Pelvis W Contrast  Result Date: 09/08/2022 CLINICAL DATA:  Rectal pain. Clinical suspicion for perirectal abscess. EXAM: CT ABDOMEN AND PELVIS WITH CONTRAST TECHNIQUE: Multidetector CT imaging of the abdomen and pelvis was performed using the standard protocol following bolus administration of intravenous contrast. RADIATION DOSE REDUCTION: This exam was performed according to the departmental dose-optimization program which includes automated exposure control, adjustment of the mA and/or kV according to patient size and/or use of iterative reconstruction technique. CONTRAST:  155mL OMNIPAQUE IOHEXOL 300 MG/ML  SOLN COMPARISON:  01/13/2020 FINDINGS: Lower Chest: No acute findings. Hepatobiliary: No hepatic masses identified. Gallbladder is unremarkable. No evidence of biliary ductal dilatation. Pancreas:  No mass or inflammatory changes. Spleen: Within normal limits in size and appearance. Adrenals/Urinary Tract: Stable small benign right adrenal adenoma (no followup imaging is recommended). No suspicious masses identified. No evidence of ureteral calculi or hydronephrosis. Diffuse bladder wall thickening is seen, likely due to chronic bladder outlet obstruction given enlarged prostate. Stomach/Bowel: Small hiatal hernia again noted. No evidence of obstruction. Normal appendix visualized. Diverticulosis is seen mainly involving the descending and sigmoid colon, however there is no evidence of diverticulitis. Rim enhancing fluid collection is seen in the right lateral wall of the anus measuring 3.4 x 1.7 cm, increased in size from 2.1 x 1.2 cm previously. This is consistent with a perianal abscess. Vascular/Lymphatic: No pathologically enlarged lymph nodes. No acute vascular findings.  Aortic atherosclerotic calcification incidentally noted. Reproductive: Moderately enlarged prostate. No mass or other significant abnormality. Other:  None. Musculoskeletal:  No suspicious bone lesions identified. IMPRESSION: Increased size of 3.4 cm right perianal abscess. Colonic diverticulosis, without radiographic evidence of diverticulitis. Stable small hiatal hernia. Moderately enlarged prostate, and findings of chronic bladder outlet obstruction. Aortic Atherosclerosis (ICD10-I70.0). Electronically Signed   By: Marlaine Hind M.D.   On: 09/08/2022 17:45    Review of Systems  Constitutional: Negative.   HENT: Negative.    Respiratory: Negative.    Cardiovascular: Negative.   Gastrointestinal: Negative.   Genitourinary: Negative.   Musculoskeletal: Negative.   Skin: Negative.   Neurological: Negative.   Endo/Heme/Allergies: Negative.   Psychiatric/Behavioral: Negative.      PE Blood pressure 137/68, pulse 82, temperature 98.4 F (36.9 C), resp. rate 17, SpO2 99 %. Constitutional: NAD; conversant; no deformities Eyes: Moist conjunctiva; no lid lag; anicteric; blind Neck: Trachea midline; no thyromegaly Lungs: Normal respiratory effort; no tactile fremitus CV: RRR; no palpable thrills; no pitting edema GI: Abd soft; no palpable hepatosplenomegaly MSK: unable to asses gait; no clubbing/cyanosis Psychiatric: Appropriate affect; alert and oriented x3 Lymphatic: No palpable cervical or axillary lymphadenopathy GU right sided perirectal abscess Skin: No major subcutaneous nodules. Warm and dry   Assessment/Plan: 77 yo male with perirectal abscess -plan for bedside I+D -oral antibiotics  I reviewed last 24 h vitals and pain scores, last 48 h intake and output, last 24 h labs and trends, and last 24 h imaging results.  This care required high  level of medical decision making.   Jose Roberts 09/08/2022, 8:03 PM

## 2022-09-08 NOTE — Procedures (Signed)
Preoperative diagnosis: perirectal abscess  Postoperative diagnosis: same   Procedure: incision and drainage of perirectal abscess  Surgeon: Gurney Maxin, M.D.  Asst: none  Anesthesia: local  Indications for procedure: Jose Roberts is a 77 y.o. year old male with symptoms of perianal pain and findings of abscess.  Description of procedure: The area was identified. Time out performed. Betadine was used to clean the area. 7 ml of 2% lidocaine with epi was infused into the area. Anesthesia was ensured. A cruciate incision was made with large amount of purulent drainage. Gauze was placed between the buttocks once the drainage had stopped. The patient tolerated the procedure well  Findings: perirectal abscess  Specimen: none  Implant: gauze dressing   Blood loss: 20 ml  Local anesthesia: 7 ml 2% lidocaine with pi  Complications: none  Gurney Maxin, M.D. General, Bariatric, & Minimally Invasive Surgery [facility]

## 2022-09-08 NOTE — Assessment & Plan Note (Signed)
-  Continue amlodipine, HCTZ, valsartan

## 2022-09-08 NOTE — H&P (Signed)
History and Physical    Patient: Jose Roberts:[id number] DOB: 1946/07/05 DOA: 09/08/2022 DOS: the patient was seen and examined on 09/08/2022 PCP: Shelda Pal, DO  Patient coming from: Home  Chief Complaint:  Chief Complaint  Patient presents with   Rectal Bleeding   HPI: Jose Roberts is a 77 y.o. male with medical history significant of HTN, HLD, insulin dependent T2DM, blindness, remote hx of Fournier's gangrene who presents with concerns of rectal pain.  Pt felt a hard nodule and rectal pain about 4 days ago. Has been doing sitz bath daily. He has remote hx of perianal abscess with resultant Fournier's gangrene. Denies any chills or fever. No nausea, vomiting.   In the ED, afebrile, leukocytosis of 13.8. CMP unremarkable.   CT A/P with contrast shows irght perianal abscess around 3.2cm.  EDP consulted general surgery Dr. Rich Number who will evaluate. Pt has been given a dose of Zosyn. Hospitalist then consulted for admission.  Review of Systems: As mentioned in the history of present illness. All other systems reviewed and are negative. 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 02/19/2011   Hemorrhoids    Hypertension    Past Surgical History:  Procedure Laterality Date   EYE SURGERY     HEMORRHOID SURGERY     revision   RETINAL DETACHMENT SURGERY     surgery on gangrene  May 6/7/8, 2012 (x 3 debridements)   Fournier's Gangrene of perineum   Social History:  reports that he has never smoked. He has never used smokeless tobacco. He reports current alcohol use of about 12.0 standard drinks of alcohol per week. He reports that he does not use drugs.  No Known Allergies  Family History  Problem Relation Age of Onset   Diabetes Mother    Hypertension Mother    Cancer Mother        pancreas   Diabetes Father    Hypertension Father    Diabetes Brother    Diabetes Sister      Prior to Admission medications   Medication Sig Start Date End Date Taking? Authorizing Provider  amLODipine (NORVASC) 10 MG tablet Take 1 tablet (10 mg total) by mouth daily. 07/12/22   Shelda Pal, DO  glucose blood (PRODIGY NO CODING BLOOD GLUC) test strip USE TO CHECK BLOOD SUGAR 3  TIMES DAILY 05/21/22   Nani Ravens, Crosby Oyster, DO  hydrochlorothiazide (HYDRODIURIL) 25 MG tablet Take 1 tablet (25 mg total) by mouth daily. 07/12/22   Shelda Pal, DO  Insulin Pen Needle (PEN NEEDLES) 32G X 4 MM MISC Inject 35 Units as directed daily. To use with levemir 02/18/19   Shelda Pal, DO  LEVEMIR FLEXPEN 100 UNIT/ML FlexPen INJECT SUBCUTANEOUSLY 38 UNITS  DAILY 07/24/22   Shelda Pal, DO  metFORMIN (GLUCOPHAGE-XR) 500 MG 24 hr tablet TAKE 2 TABLETS BY MOUTH TWICE DAILY WITH BREAKFAST AND WITH SUPPER 07/12/22   Wendling, Crosby Oyster, DO  Multiple Vitamin (MULTIVITAMIN WITH MINERALS) TABS Take 1 tablet by mouth daily.    [provider]  pioglitazone (ACTOS) 30 MG tablet Take 1 tablet (30 mg total) by mouth daily. 07/12/22   Shelda Pal, DO  pravastatin (PRAVACHOL) 40 MG tablet Take 1 tablet (40 mg total) by mouth daily. 07/12/22   Shelda Pal, DO  sildenafil (VIAGRA) 100 MG tablet Take 1  tablet (100 mg total) by mouth as needed for erectile dysfunction. 06/23/21   Shelda Pal, DO  valsartan (DIOVAN) 160 MG tablet Take 1 tablet (160 mg total) by mouth daily. 07/12/22   Shelda Pal, DO    Physical Exam: Vitals:   09/08/22 0923 09/08/22 1223 09/08/22 1705  BP: (!) 156/74 (!) 154/87 137/68  Pulse: 93 93 82  Resp: 18 18 17   Temp: 97.6 F (36.4 C) 98.5 F (36.9 C) 98.4 F (36.9 C)  TempSrc: Oral Oral   SpO2: 97% 99% 99%   Constitutional: NAD, calm, comfortable, well appearing elderly male laying flat in bed Eyes: left eye remains closed with normal lid. Right eye partially open unable to  visualize pupils. Has chronic blindness. ENMT: Mucous membranes are moist.  Neck: normal, supple Respiratory: clear to auscultation bilaterally, no wheezing, no crackles. Normal respiratory effort. No accessory muscle use.  Cardiovascular: Regular rate and rhythm, no murmurs / rubs / gallops. No extremity edema.   Abdomen: no tenderness,. Bowel sounds positive.  Rectal: large indurated right per-anal edematous nodule with purulent discharge Musculoskeletal: no clubbing / cyanosis. No joint deformity upper and lower extremities.  Normal muscle tone.  Skin: see rectal exam above Neurologic: CN 2-12 grossly intact. Strength 5/5 in all 4.  Psychiatric: Normal judgment and insight. Alert and oriented x 3. Normal mood. Data Reviewed:  See HPI  Assessment and Plan: * Perirectal abscess -CT A/P with contrast shows right perianal abscess around 3.2cm. Appears to span the right peri-anal/gluteal cleft on exam. Likely will need to be taken to OR for I&D rather than at bedside but will aware further recommendations from surgery -continue IV Zosyn -PRN Tylenol and hydrocodone for pain  Hyperlipidemia -continue statin  Essential hypertension -Continue amlodipine, HCTZ, valsartan  Diabetes mellitus type 2 in obese (HCC) -insulin dependent with last A1C in September of 7.6 -Home regimen: 30U of Levemir in the morning -place on moderate SSI while NPO      Advance Care Planning: Full Consults: General surgery  Family Communication: son at bedside  Severity of Illness: The appropriate patient status for this patient is OBSERVATION. Observation status is judged to be reasonable and necessary in order to provide the required intensity of service to ensure the patient's safety. The patient's presenting symptoms, physical exam findings, and initial radiographic and laboratory data in the context of their medical condition is felt to place them at decreased risk for further clinical deterioration.  Furthermore, it is anticipated that the patient will be medically stable for discharge from the hospital within 2 midnights of admission.   Author: Orene Desanctis, DO 09/08/2022 7:45 PM  For on call review [email or web address].

## 2022-09-08 NOTE — ED Notes (Signed)
Patient transported to CT 

## 2022-09-08 NOTE — Assessment & Plan Note (Addendum)
-  insulin dependent with last A1C in September of 7.6 -Home regimen: 30U of Levemir in the morning -place on moderate SSI while NPO

## 2022-09-08 NOTE — ED Notes (Signed)
Unsuccessful IV attempt x2.  

## 2022-09-08 NOTE — Assessment & Plan Note (Signed)
-  continue statin

## 2022-09-08 NOTE — Assessment & Plan Note (Addendum)
-  CT A/P with contrast shows right perianal abscess around 3.2cm. Appears to span the right peri-anal/gluteal cleft on exam.  General surgery was able to perform I&D at bedside and he was discharged with Augmentin

## 2022-09-08 NOTE — ED Provider Triage Note (Signed)
Emergency Medicine Provider Triage Evaluation Note  Jose Roberts , a 77 y.o. male  was evaluated in triage.  Pt complains of concerns for rectal bleeding x 2 days.  Notes history of similar symptoms.  He notes that he was constipated prior to the onset of his symptoms.  Has tried sitz bath's and Preparation H at home for his symptoms.  Denies chest pain, shortness of breath, abdominal pain, nausea, vomiting.   Review of Systems  Positive:  Negative:   Physical Exam  BP (!) 156/74 (BP Location: Left Arm)   Pulse 93   Temp 97.6 F (36.4 C) (Oral)   Resp 18   SpO2 97%  Gen:   Awake, no distress   Resp:  Normal effort  MSK:   Moves extremities without difficulty  Other:  Rectal exam deferred in triage. No abdominal TTP.   Medical Decision Making  Medically screening exam initiated at 9:44 AM.  Appropriate orders placed.  [name] was informed that the remainder of the evaluation will be completed by another provider, this initial triage assessment does not replace that evaluation, and the importance of remaining in the ED until their evaluation is complete.  Workup initiated   Caillou Minus A, PA-C 09/08/22 [phone]

## 2022-09-08 NOTE — ED Provider Notes (Signed)
[place] EMERGENCY DEPARTMENT AT [facility] Provider Note   CSN: [id number] Arrival date & time: 09/08/22  0915     History  Chief Complaint  Patient presents with   Rectal Bleeding    Jose Roberts is a 77 y.o. male.  77 year old male with prior medical history detailed below presents for evaluation.  Patient complains of increasing rectal pain x 2 to 3 days.  Patient with history of Fournier's gangrene approximately 10 years ago.  Patient is concerned for recurrent abscess and/or infection.  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  He reports that his family has not noted any rectal bleeding.  Patient with history of both diabetes and hypertension in addition to history of Fournier's requiring surgical debridement.  The history is provided by the patient and medical records.       Home Medications Prior to Admission medications   Medication Sig Start Date End Date Taking? Authorizing Provider  amLODipine (NORVASC) 10 MG tablet Take 1 tablet (10 mg total) by mouth daily. 07/12/22   Shelda Pal, DO  glucose blood (PRODIGY NO CODING BLOOD GLUC) test strip USE TO CHECK BLOOD SUGAR 3  TIMES DAILY 05/21/22   Nani Ravens, Crosby Oyster, DO  hydrochlorothiazide (HYDRODIURIL) 25 MG tablet Take 1 tablet (25 mg total) by mouth daily. 07/12/22   Shelda Pal, DO  Insulin Pen Needle (PEN NEEDLES) 32G X 4 MM MISC Inject 35 Units as directed daily. To use with levemir 02/18/19   Shelda Pal, DO  LEVEMIR FLEXPEN 100 UNIT/ML FlexPen INJECT SUBCUTANEOUSLY 38 UNITS  DAILY 07/24/22   Shelda Pal, DO  metFORMIN (GLUCOPHAGE-XR) 500 MG 24 hr tablet TAKE 2 TABLETS BY MOUTH TWICE DAILY WITH BREAKFAST AND WITH SUPPER 07/12/22   Wendling, Crosby Oyster, DO  Multiple Vitamin (MULTIVITAMIN WITH MINERALS) TABS Take 1 tablet by mouth daily.    [provider]  pioglitazone (ACTOS) 30 MG tablet Take 1 tablet (30 mg total) by mouth daily.  07/12/22   Shelda Pal, DO  pravastatin (PRAVACHOL) 40 MG tablet Take 1 tablet (40 mg total) by mouth daily. 07/12/22   Shelda Pal, DO  sildenafil (VIAGRA) 100 MG tablet Take 1 tablet (100 mg total) by mouth as needed for erectile dysfunction. 06/23/21   Shelda Pal, DO  valsartan (DIOVAN) 160 MG tablet Take 1 tablet (160 mg total) by mouth daily. 07/12/22   Shelda Pal, DO      Allergies    Patient has no known allergies.    Review of Systems   Review of Systems  All other systems reviewed and are negative.   Physical Exam Updated Vital Signs BP (!) 154/87 (BP Location: Right Arm)   Pulse 93   Temp 98.5 F (36.9 C) (Oral)   Resp 18   SpO2 99%  Physical Exam Vitals and nursing note reviewed.  Constitutional:      General: He is not in acute distress.    Appearance: Normal appearance. He is well-developed.  HENT:     Head: Normocephalic and atraumatic.  Eyes:     Conjunctiva/sclera: Conjunctivae normal.     Pupils: Pupils are equal, round, and reactive to light.  Cardiovascular:     Rate and Rhythm: Normal rate and regular rhythm.     Heart sounds: Normal heart sounds.  Pulmonary:     Effort: Pulmonary effort is normal. No respiratory distress.     Breath sounds: Normal breath sounds.  Abdominal:  General: There is no distension.     Palpations: Abdomen is soft.     Tenderness: There is no abdominal tenderness.  Genitourinary:    Comments: Softly fluctuant area (approx 3x2 cm) along right side of rectum.  No bleeding noted.  Exam concerning for perirectal abscess. Musculoskeletal:        General: No deformity. Normal range of motion.     Cervical back: Normal range of motion and neck supple.  Skin:    General: Skin is warm and dry.  Neurological:     General: No focal deficit present.     Mental Status: He is alert and oriented to person, place, and time.     ED Results / Procedures / Treatments   Labs (all  labs ordered are listed, but only abnormal results are displayed) Labs Reviewed  CBC WITH DIFFERENTIAL/PLATELET - Abnormal; Notable for the following components:      Result Value   WBC 13.8 (*)    Neutro Abs 10.9 (*)    Monocytes Absolute 1.3 (*)    Abs Immature Granulocytes 0.08 (*)    All other components within normal limits  COMPREHENSIVE METABOLIC PANEL - Abnormal; Notable for the following components:   Glucose, Bld 190 (*)    All other components within normal limits  LIPASE, BLOOD  URINALYSIS, ROUTINE W REFLEX MICROSCOPIC  POC OCCULT BLOOD, ED    EKG None  Radiology No results found.  Procedures Procedures    Medications Ordered in ED Medications - No data to display  ED Course/ Medical Decision Making/ A&P                             Medical Decision Making Amount and/or Complexity of Data Reviewed Radiology: ordered.  Risk Prescription drug management.    Medical Screen Complete  This patient presented to the ED with complaint of rectal pain.  This complaint involves an extensive number of treatment options. The initial differential diagnosis includes, but is not limited to, perirectal abscess, Fournier's, metabolic abnormality, etc.  This presentation is: Acute, Self-Limited, Previously Undiagnosed, Uncertain Prognosis, Complicated, Systemic Symptoms, and Threat to Life/Bodily Function  Patient is presenting with complaint of worsening rectal pain.  Patient with history of Fournier's gangrene approximately 10 years ago.  Patient with history of prior perirectal abscess.  Patient is concerned about recurrent perirectal abscess.  Notably patient with history of hypertension diabetes as well.  CT imaging reveals evidence of 3 x 2 cm perirectal abscess.  Dr. Sheliah Hatch with general surgery is aware of case.  He will evaluate in consultation.  He is requesting IV antibiotics and admission to Hospitalist service.  Hospitalist service made aware of  case and will evaluate for admission.    Co morbidities that complicated the patient's evaluation  DM, HTN, Blind   Additional history obtained: External records from outside sources obtained and reviewed including prior ED visits and prior Inpatient records.    Lab Tests:  I ordered and personally interpreted labs.  The pertinent results include: CBC, CMP, lipase, UA, covid/flu   Imaging Studies ordered:  I ordered imaging studies including ct ap  I independently visualized and interpreted obtained imaging which showed peri-rectal abscess I agree with the radiologist interpretation.   Cardiac Monitoring:  The patient was maintained on a cardiac monitor.  I personally viewed and interpreted the cardiac monitor which showed an underlying rhythm of: NSR   Medicines ordered:  I ordered medication  including zosyn  for peri-rectal abscess  Reevaluation of the patient after these medicines showed that the patient: improved    Problem List / ED Course:  Peri-rectal abscess   Reevaluation:  After the interventions noted above, I reevaluated the patient and found that they have: improved   Disposition:  After consideration of the diagnostic results and the patients response to treatment, I feel that the patent would benefit from admission.          Final Clinical Impression(s) / ED Diagnoses Final diagnoses:  Peri-rectal abscess    Rx / DC Orders ED Discharge Orders     None         Valarie Merino, MD 09/08/22 [phone]

## 2022-09-10 ENCOUNTER — Telehealth: Payer: Self-pay

## 2022-09-10 NOTE — Telephone Encounter (Signed)
Transition Care Management Follow-up Telephone Call Date of discharge and from where: Jose Roberts 09/08/2022 How have you been since you were released from the hospital? A little better Any questions or concerns? No  Items Reviewed: Did the pt receive and understand the discharge instructions provided? Yes  Medications obtained and verified? Yes  Other? No  Any new allergies since your discharge? No  Dietary orders reviewed? Yes Do you have support at home? Yes   Home Care and Equipment/Supplies: Were home health services ordered? no If so, what is the name of the agency? N/a  Has the agency set up a time to come to the patient's home? not applicable Were any new equipment or medical supplies ordered?  No What is the name of the medical supply agency? N/a Were you able to get the supplies/equipment? not applicable Do you have any questions related to the use of the equipment or supplies? No  Functional Questionnaire: (I = Independent and D = Dependent) ADLs: I  Bathing/Dressing- I  Meal Prep- I  Eating- I  Maintaining continence- I  Transferring/Ambulation- I  Managing Meds- I  Follow up appointments reviewed:  PCP Hospital f/u appt confirmed? No   Specialist Hospital f/u appt confirmed? No Patient will schedule Are transportation arrangements needed? No  If their condition worsens, is the pt aware to call PCP or go to the Emergency Dept.? Yes Was the patient provided with contact information for the PCP's office or ED? Yes Was to pt encouraged to call back with questions or concerns? Yes Juanda Crumble, LPN [facility] [phone]

## 2022-09-13 ENCOUNTER — Ambulatory Visit (INDEPENDENT_AMBULATORY_CARE_PROVIDER_SITE_OTHER): Payer: Medicare Other | Admitting: *Deleted

## 2022-09-13 DIAGNOSIS — Z0000 Encounter for general adult medical examination without abnormal findings: Secondary | ICD-10-CM

## 2022-09-13 NOTE — Patient Instructions (Signed)
Jose Roberts , Thank you for taking time to come for your Medicare Wellness Visit. I appreciate your ongoing commitment to your health goals. Please review the following plan we discussed and let me know if I can assist you in the future.   These are the goals we discussed:  Goals      Patient Stated     Eat healthier & walk more        This is a list of the screening recommended for you and due dates:  Health Maintenance  Topic Date Due   DTaP/Tdap/Td vaccine (2 - Td or Tdap) 05/08/2021   COVID-19 Vaccine (6 - 2023-24 season) 10/31/2022*   Zoster (Shingles) Vaccine (1 of 2) 10/31/2022*   Complete foot exam   10/19/2022   Hemoglobin A1C  10/31/2022   Yearly kidney health urinalysis for diabetes  05/03/2023   Yearly kidney function blood test for diabetes  09/09/2023   Medicare Annual Wellness Visit  09/14/2023   Colon Cancer Screening  06/12/2025   Pneumonia Vaccine  Completed   Flu Shot  Completed   Hepatitis C Screening: USPSTF Recommendation to screen - Ages 18-79 yo.  Completed   HPV Vaccine  Aged Out   Eye exam for diabetics  Discontinued  *Topic was postponed. The date shown is not the original due date.     Next appointment: Follow up in one year for your annual wellness visit.   Preventive Care 71 Years and Older, Male Preventive care refers to lifestyle choices and visits with your health care provider that can promote health and wellness. What does preventive care include? A yearly physical exam. This is also called an annual well check. Dental exams once or twice a year. Routine eye exams. Ask your health care provider how often you should have your eyes checked. Personal lifestyle choices, including: Daily care of your teeth and gums. Regular physical activity. Eating a healthy diet. Avoiding tobacco and drug use. Limiting alcohol use. Practicing safe sex. Taking low doses of aspirin every day. Taking vitamin and mineral supplements as recommended by your  health care provider. What happens during an annual well check? The services and screenings done by your health care provider during your annual well check will depend on your age, overall health, lifestyle risk factors, and family history of disease. Counseling  Your health care provider may ask you questions about your: Alcohol use. Tobacco use. Drug use. Emotional well-being. Home and relationship well-being. Sexual activity. Eating habits. History of falls. Memory and ability to understand (cognition). Work and work Statistician. Screening  You may have the following tests or measurements: Height, weight, and BMI. Blood pressure. Lipid and cholesterol levels. These may be checked every 5 years, or more frequently if you are over 29 years old. Skin check. Lung cancer screening. You may have this screening every year starting at age 68 if you have a 30-pack-year history of smoking and currently smoke or have quit within the past 15 years. Fecal occult blood test (FOBT) of the stool. You may have this test every year starting at age 32. Flexible sigmoidoscopy or colonoscopy. You may have a sigmoidoscopy every 5 years or a colonoscopy every 10 years starting at age 54. Prostate cancer screening. Recommendations will vary depending on your family history and other risks. Hepatitis C blood test. Hepatitis B blood test. Sexually transmitted disease (STD) testing. Diabetes screening. This is done by checking your blood sugar (glucose) after you have not eaten for a while (fasting).  You may have this done every 1-3 years. Abdominal aortic aneurysm (AAA) screening. You may need this if you are a current or former smoker. Osteoporosis. You may be screened starting at age 46 if you are at high risk. Talk with your health care provider about your test results, treatment options, and if necessary, the need for more tests. Vaccines  Your health care provider may recommend certain vaccines, such  as: Influenza vaccine. This is recommended every year. Tetanus, diphtheria, and acellular pertussis (Tdap, Td) vaccine. You may need a Td booster every 10 years. Zoster vaccine. You may need this after age 32. Pneumococcal 13-valent conjugate (PCV13) vaccine. One dose is recommended after age 41. Pneumococcal polysaccharide (PPSV23) vaccine. One dose is recommended after age 50. Talk to your health care provider about which screenings and vaccines you need and how often you need them. This information is not intended to replace advice given to you by your health care provider. Make sure you discuss any questions you have with your health care provider. Document Released: 08/26/2015 Document Revised: 04/18/2016 Document Reviewed: 05/31/2015 Elsevier Interactive Patient Education  2017 [place] Prevention in the Home Falls can cause injuries. They can happen to people of all ages. There are many things you can do to make your home safe and to help prevent falls. What can I do on the outside of my home? Regularly fix the edges of walkways and driveways and fix any cracks. Remove anything that might make you trip as you walk through a door, such as a raised step or threshold. Trim any bushes or trees on the path to your home. Use bright outdoor lighting. Clear any walking paths of anything that might make someone trip, such as rocks or tools. Regularly check to see if handrails are loose or broken. Make sure that both sides of any steps have handrails. Any raised decks and porches should have guardrails on the edges. Have any leaves, snow, or ice cleared regularly. Use sand or salt on walking paths during winter. Clean up any spills in your garage right away. This includes oil or grease spills. What can I do in the bathroom? Use night lights. Install grab bars by the toilet and in the tub and shower. Do not use towel bars as grab bars. Use non-skid mats or decals in the tub or  shower. If you need to sit down in the shower, use a plastic, non-slip stool. Keep the floor dry. Clean up any water that spills on the floor as soon as it happens. Remove soap buildup in the tub or shower regularly. Attach bath mats securely with double-sided non-slip rug tape. Do not have throw rugs and other things on the floor that can make you trip. What can I do in the bedroom? Use night lights. Make sure that you have a light by your bed that is easy to reach. Do not use any sheets or blankets that are too big for your bed. They should not hang down onto the floor. Have a firm chair that has side arms. You can use this for support while you get dressed. Do not have throw rugs and other things on the floor that can make you trip. What can I do in the kitchen? Clean up any spills right away. Avoid walking on wet floors. Keep items that you use a lot in easy-to-reach places. If you need to reach something above you, use a strong step stool that has a grab bar. Keep  electrical cords out of the way. Do not use floor polish or wax that makes floors slippery. If you must use wax, use non-skid floor wax. Do not have throw rugs and other things on the floor that can make you trip. What can I do with my stairs? Do not leave any items on the stairs. Make sure that there are handrails on both sides of the stairs and use them. Fix handrails that are broken or loose. Make sure that handrails are as long as the stairways. Check any carpeting to make sure that it is firmly attached to the stairs. Fix any carpet that is loose or worn. Avoid having throw rugs at the top or bottom of the stairs. If you do have throw rugs, attach them to the floor with carpet tape. Make sure that you have a light switch at the top of the stairs and the bottom of the stairs. If you do not have them, ask someone to add them for you. What else can I do to help prevent falls? Wear shoes that: Do not have high heels. Have  rubber bottoms. Are comfortable and fit you well. Are closed at the toe. Do not wear sandals. If you use a stepladder: Make sure that it is fully opened. Do not climb a closed stepladder. Make sure that both sides of the stepladder are locked into place. Ask someone to hold it for you, if possible. Clearly mark and make sure that you can see: Any grab bars or handrails. First and last steps. Where the edge of each step is. Use tools that help you move around (mobility aids) if they are needed. These include: Canes. Walkers. Scooters. Crutches. Turn on the lights when you go into a dark area. Replace any light bulbs as soon as they burn out. Set up your furniture so you have a clear path. Avoid moving your furniture around. If any of your floors are uneven, fix them. If there are any pets around you, be aware of where they are. Review your medicines with your doctor. Some medicines can make you feel dizzy. This can increase your chance of falling. Ask your doctor what other things that you can do to help prevent falls. This information is not intended to replace advice given to you by your health care provider. Make sure you discuss any questions you have with your health care provider. Document Released: 05/26/2009 Document Revised: 01/05/2016 Document Reviewed: 09/03/2014 Elsevier Interactive Patient Education  2017 [employer].

## 2022-09-13 NOTE — Progress Notes (Signed)
I have reviewed and agree with [facility] documentation.  Kathlene November, MD

## 2022-09-13 NOTE — Progress Notes (Signed)
Subjective:   Jose Roberts is a 77 y.o. male who presents for Medicare Annual/Subsequent preventive examination.  I connected with  Livia Snellen on 09/13/22 by a audio enabled telemedicine application and verified that I am speaking with the correct person using two identifiers.  Patient Location: Home  Provider Location: Office/Clinic  I discussed the limitations of evaluation and management by telemedicine. The patient expressed understanding and agreed to proceed.   Review of Systems    Defer to PCP Cardiac Risk Factors include: advanced age (>31men, >54 women);male gender;diabetes mellitus;dyslipidemia;hypertension;obesity (BMI >30kg/m2)     Objective:    There were no vitals filed for this visit. There is no height or weight on file to calculate BMI.     09/13/2022    8:23 AM 08/11/2021    7:47 AM 01/12/2020    7:34 PM 09/22/2019    5:30 AM 03/06/2017    7:11 PM  Advanced Directives  Does Patient Have a Medical Advance Directive? Yes Yes No No No  Type of Paramedic of [place];Living will [place];Living will     Does patient want to make changes to medical advance directive? No - Patient declined      Copy of [place] in Chart? No - copy requested No - copy requested     Would patient like information on creating a medical advance directive?   No - Patient declined      Current Medications (verified) Outpatient Encounter Medications as of 09/13/2022  Medication Sig   amLODipine (NORVASC) 10 MG tablet Take 1 tablet (10 mg total) by mouth daily.   amoxicillin-clavulanate (AUGMENTIN) 875-125 MG tablet Take 1 tablet by mouth every 12 (twelve) hours for 14 doses.   glucose blood (PRODIGY NO CODING BLOOD GLUC) test strip USE TO CHECK BLOOD SUGAR 3  TIMES DAILY   hydrochlorothiazide (HYDRODIURIL) 25 MG tablet Take 1 tablet (25 mg total) by mouth daily.   Insulin Pen Needle (PEN NEEDLES) 32G X 4 MM MISC Inject 35  Units as directed daily. To use with levemir   LEVEMIR FLEXPEN 100 UNIT/ML FlexPen INJECT SUBCUTANEOUSLY 38 UNITS  DAILY   metFORMIN (GLUCOPHAGE-XR) 500 MG 24 hr tablet TAKE 2 TABLETS BY MOUTH TWICE DAILY WITH BREAKFAST AND WITH SUPPER   Multiple Vitamin (MULTIVITAMIN WITH MINERALS) TABS Take 1 tablet by mouth daily.   nirmatrelvir/ritonavir (PAXLOVID) 20 x 150 MG & 10 x 100MG  TABS Take 3 tablets by mouth 2 (two) times daily for 5 days. Patient GFR is 60. Take nirmatrelvir (150 mg) two tablets twice daily for 5 days and ritonavir (100 mg) one tablet twice daily for 5 days.   pioglitazone (ACTOS) 30 MG tablet Take 1 tablet (30 mg total) by mouth daily.   pravastatin (PRAVACHOL) 40 MG tablet Take 1 tablet (40 mg total) by mouth daily.   sildenafil (VIAGRA) 100 MG tablet Take 1 tablet (100 mg total) by mouth as needed for erectile dysfunction.   traMADol (ULTRAM) 50 MG tablet Take 1 tablet (50 mg total) by mouth every 8 (eight) hours as needed.   valsartan (DIOVAN) 160 MG tablet Take 1 tablet (160 mg total) by mouth daily.   No facility-administered encounter medications on file as of 09/13/2022.    Allergies (verified) Patient has no known allergies.   History: Past Medical History:  Diagnosis Date   Blind    Diabetes mellitus    Fistula    Fournier's gangrene - s/p I&D May  2012 x2 02/19/2011   Hemorrhoids    Hypertension    Past Surgical History:  Procedure Laterality Date   EYE SURGERY     HEMORRHOID SURGERY     revision   RETINAL DETACHMENT SURGERY     surgery on gangrene  May 6/7/8, 2012 (x 3 debridements)   Fournier's Gangrene of perineum   Family History  Problem Relation Age of Onset   Diabetes Mother    Hypertension Mother    Cancer Mother        pancreas   Diabetes Father    Hypertension Father    Diabetes Brother    Diabetes Sister    Social History   Socioeconomic History   Marital status: Married    Spouse name: Not on file   Number of children: Not on file    Years of education: Not on file   Highest education level: Not on file  Occupational History   Not on file  Tobacco Use   Smoking status: Never   Smokeless tobacco: Never  Substance and Sexual Activity   Alcohol use: Yes    Alcohol/week: 12.0 standard drinks of alcohol    Types: 12 Cans of beer per week    Comment: per week   Drug use: No   Sexual activity: Not Currently  Other Topics Concern   Not on file  Social History Narrative   Not on file   Social Determinants of Health   Financial Resource Strain: Low Risk  (08/11/2021)   Overall Financial Resource Strain (CARDIA)    Difficulty of Paying Living Expenses: Not hard at all  Food Insecurity: No Food Insecurity (09/13/2022)   Hunger Vital Sign    Worried About Running Out of Food in the Last Year: Never true    [place] in the Last Year: Never true  Transportation Needs: No Transportation Needs (09/13/2022)   PRAPARE - Hydrologist (Medical): No    Lack of Transportation (Non-Medical): No  Physical Activity: Inactive (08/11/2021)   Exercise Vital Sign    Days of Exercise per Week: 0 days    Minutes of Exercise per Session: 0 min  Stress: No Stress Concern Present (08/11/2021)   [place]    Feeling of Stress : Not at all  Social Connections: [place] (08/11/2021)   Social Connection and Isolation Panel [NHANES]    Frequency of Communication with Friends and Family: More than three times a week    Frequency of Social Gatherings with Friends and Family: More than three times a week    Attends Religious Services: More than 4 times per year    Active Member of [employer] or Organizations: Yes    Attends Music therapist: More than 4 times per year    Marital Status: Married    Tobacco Counseling Counseling given: Not Answered   Clinical Intake:  Pre-visit preparation completed: Yes  Pain :  No/denies pain  How often do you need to have someone help you when you read instructions, pamphlets, or other written materials from your doctor or pharmacy?: 1 - Never  Diabetic? Nutrition Risk Assessment:  Has the patient had any N/V/D within the last 2 months?  No  Does the patient have any non-healing wounds?  No  Has the patient had any unintentional weight loss or weight gain?  No   Diabetes:  Is the patient diabetic?  No  If diabetic,  was a CBG obtained today?  No  Did the patient bring in their glucometer from home?  No  How often do you monitor your CBG's? Once daily.   Financial Strains and Diabetes Management:  Are you having any financial strains with the device, your supplies or your medication? No .  Does the patient want to be seen by Chronic Care Management for management of their diabetes?  No  Would the patient like to be referred to a Nutritionist or for Diabetic Management?  No   Diabetic Exams:  Diabetic Eye Exam: Overdue for diabetic eye exam. Pt has been advised about the importance in completing this exam. Patient advised to call and schedule an eye exam. Diabetic Foot Exam: Completed 10/18/21   Interpreter Needed?: No  Information entered by :: Beatris Ship, [place]   Activities of Daily Living    09/13/2022    8:28 AM  In your present state of health, do you have any difficulty performing the following activities:  Hearing? 1  Comment slight hearing loss  Vision? 1  Comment pt is blind  Difficulty concentrating or making decisions? 0  Walking or climbing stairs? 0  Dressing or bathing? 0  Doing errands, shopping? 1  Comment pt doesn't drive due to being blind  Preparing Food and eating ? Y  Using the Toilet? N  In the past six months, have you accidently leaked urine? N  Do you have problems with loss of bowel control? N  Managing your Medications? N  Managing your Finances? Y  Housekeeping or managing your Housekeeping? Y    Patient Care  Team: Shelda Pal, DO as PCP - General (Family Medicine)  Indicate any recent Medical Services you may have received from other than [facility] providers in the past year (date may be approximate).     Assessment:   This is a routine wellness examination for [place].  Hearing/Vision screen No results found.  Dietary issues and exercise activities discussed: Current Exercise Habits: Home exercise routine, Type of exercise: treadmill;strength training/weights (stationary bike), Time (Minutes): 45, Frequency (Times/Week): 2, Weekly Exercise (Minutes/Week): 90, Intensity: Mild, Exercise limited by: Other - see comments   Goals Addressed   None    Depression Screen    09/13/2022    8:27 AM 05/02/2022    7:08 AM 08/11/2021    7:51 AM 03/22/2021    7:31 AM 02/03/2020    7:59 AM 05/09/2018    7:17 AM  PHQ 2/9 Scores  PHQ - 2 Score 0 0 0 0 0 0    Fall Risk    09/13/2022    8:25 AM 05/02/2022    7:08 AM 08/11/2021    7:48 AM 03/22/2021    7:31 AM 02/03/2020    8:00 AM  Fall Risk   Falls in the past year? 0 0 1 0 1  Comment     Due to blindness  Number falls in past yr: 0 0 0 0 0  Injury with Fall? 0 0 0 0   Risk for fall due to : Impaired vision No Fall Risks History of fall(s);Impaired vision No Fall Risks   Follow up Falls evaluation completed Falls evaluation completed Falls prevention discussed Falls evaluation completed     FALL RISK PREVENTION PERTAINING TO THE HOME:  Any stairs in or around the home? Yes  If so, are there any without handrails? No  Home free of loose throw rugs in walkways, pet beds, electrical cords, etc? Yes  Adequate  lighting in your home to reduce risk of falls? Yes   ASSISTIVE DEVICES UTILIZED TO PREVENT FALLS:  Life alert? No  Use of a cane, walker or w/c?  Guide stick for blind Grab bars in the bathroom? Yes  Shower chair or bench in shower? Yes  Elevated toilet seat or a handicapped toilet? Yes   TIMED UP AND GO:  Was the test  performed?  No, audio visit .   Cognitive Function:        09/13/2022    8:33 AM  6CIT Screen  What Year? 0 points  What month? 0 points  What time? 0 points  Count back from 20 0 points  Months in reverse 0 points  Repeat phrase 0 points  Total Score 0 points    Immunizations Immunization History  Administered Date(s) Administered   Fluad Quad(high Dose 65+) 05/23/2020, 05/02/2022   Influenza, High Dose Seasonal PF 05/09/2018, 05/23/2021   Influenza-Unspecified 05/14/2019   PFIZER(Purple Top)SARS-COV-2 Vaccination 09/04/2019, 10/14/2019, 06/03/2020, 12/05/2020   Pfizer Covid-19 Vaccine Bivalent Booster 91yrs & up 06/13/2021   Pneumococcal Conjugate-13 01/03/2016   Pneumococcal Polysaccharide-23 11/02/2010, 01/03/2017, 01/31/2018   Tdap 05/09/2011   Zoster, Live 06/22/2011    TDAP status: Due, Education has been provided regarding the importance of this vaccine. Advised may receive this vaccine at local pharmacy or Health Dept. Aware to provide a copy of the vaccination record if obtained from local pharmacy or Health Dept. Verbalized acceptance and understanding.  Flu Vaccine status: Up to date  Pneumococcal vaccine status: Up to date  Covid-19 vaccine status: Information provided on how to obtain vaccines.   Qualifies for Shingles Vaccine? Yes   Zostavax completed Yes   Shingrix Completed?: No.    Education has been provided regarding the importance of this vaccine. Patient has been advised to call insurance company to determine out of pocket expense if they have not yet received this vaccine. Advised may also receive vaccine at local pharmacy or Health Dept. Verbalized acceptance and understanding.  Screening Tests Health Maintenance  Topic Date Due   DTaP/Tdap/Td (2 - Td or Tdap) 05/08/2021   Medicare Annual Wellness (AWV)  08/11/2022   COVID-19 Vaccine (6 - 2023-24 season) 10/31/2022 (Originally 04/13/2022)   Zoster Vaccines- Shingrix (1 of 2) 10/31/2022 (Originally  07/02/1997)   FOOT EXAM  10/19/2022   HEMOGLOBIN A1C  10/31/2022   Diabetic kidney evaluation - Urine ACR  05/03/2023   Diabetic kidney evaluation - eGFR measurement  09/09/2023   COLONOSCOPY (Pts 45-22yrs Insurance coverage will need to be confirmed)  06/12/2025   Pneumonia Vaccine 78+ Years old  Completed   INFLUENZA VACCINE  Completed   Hepatitis C Screening  Completed   HPV VACCINES  Aged Out   OPHTHALMOLOGY EXAM  Discontinued    Health Maintenance  Health Maintenance Due  Topic Date Due   DTaP/Tdap/Td (2 - Td or Tdap) 05/08/2021   Medicare Annual Wellness (AWV)  08/11/2022    Colorectal cancer screening: Type of screening: Colonoscopy. Completed 06/13/15. Repeat every 10 years  Lung Cancer Screening: (Low Dose CT Chest recommended if Age 2-80 years, 30 pack-year currently smoking OR have quit w/in 15years.) does not qualify.   Additional Screening:  Hepatitis C Screening: does qualify; Completed 01/31/18  Vision Screening: Recommended annual ophthalmology exams for early detection of glaucoma and other disorders of the eye. Is the patient up to date with their annual eye exam?  No  Who is the provider or what is the name of  the office in which the patient attends annual eye exams? N/a If pt is not established with a provider, would they like to be referred to a provider to establish care? No .   Dental Screening: Recommended annual dental exams for proper oral hygiene  Community Resource Referral / Chronic Care Management: CRR required this visit?  No   CCM required this visit?  No      Plan:     I have personally reviewed and noted the following in the patient's chart:   Medical and social history Use of alcohol, tobacco or illicit drugs  Current medications and supplements including opioid prescriptions. Patient is currently taking opioid prescriptions. Information provided to patient regarding non-opioid alternatives. Patient advised to discuss non-opioid  treatment plan with their provider. Functional ability and status Nutritional status Physical activity Advanced directives List of other physicians Hospitalizations, surgeries, and ER visits in previous 12 months Vitals Screenings to include cognitive, depression, and falls Referrals and appointments  In addition, I have reviewed and discussed with patient certain preventive protocols, quality metrics, and best practice recommendations. A written personalized care plan for preventive services as well as general preventive health recommendations were provided to patient.   Due to this being a telephonic visit, the after visit summary with patients personalized plan was offered to patient via mail or my-chart. Patient would like to access on my-chart.  Donne Anon, [place]   09/13/2022   Nurse Notes: None

## 2022-09-17 ENCOUNTER — Ambulatory Visit (INDEPENDENT_AMBULATORY_CARE_PROVIDER_SITE_OTHER): Payer: Medicare Other | Admitting: Pharmacist

## 2022-09-17 DIAGNOSIS — E785 Hyperlipidemia, unspecified: Secondary | ICD-10-CM

## 2022-09-17 DIAGNOSIS — I10 Essential (primary) hypertension: Secondary | ICD-10-CM

## 2022-09-17 DIAGNOSIS — E1169 Type 2 diabetes mellitus with other specified complication: Secondary | ICD-10-CM

## 2022-09-17 DIAGNOSIS — E669 Obesity, unspecified: Secondary | ICD-10-CM

## 2022-09-17 NOTE — Patient Instructions (Signed)
Mr. Pless It was a pleasure speaking with you today.  Below is a summary of our recent visit.   Diabetes: A1c not at goal of less than 7.0%  Lab Results  Component Value Date   HGBA1C 7.6 (H) 05/02/2022   - Recommend to continue current medication for diabetes  Recommend changing Levemir to [place] in future.  I did check on cost of Ozempic on your 2024 plan. Ozempic for $47 / month but would hit coverage gap in July and cost would increase to around $246 per month.  We could appy for medication assistance program for Ozempic - income cut off for 2024 is $78,800 for household of 2 people.   Recommend to check glucose daily each morning but to add checking in evening or after a meal 1 or 2 times per week.  Goals for home blood glucose  Fasting blood glucose goal (before meals) = 80 to 130 Blood glucose goal after a meal = less than 180    High Blood Pressure:   goal less than 130/80  Check home blood pressure and heart rate 1 or 2 times per week. Continue valsartan and amlodipine   High Cholesterol: Last LDL was at goal Continue pravastatin daily     Health Maintenance Recommend getting Tetanus booster and and Shingrix vaccine(s). Patient is planning to get these vaccines at the [facility] when he is in the building in March 2024.   As always if you have any questions or concerns especially regarding medications, please feel free to contact me either at the phone number below or with a MyChart message.   Keep up the good work!  Cherre Robins, PharmD Clinical Pharmacist [facility] [phone] (direct line)  [phone] (main office number)

## 2022-09-17 NOTE — Progress Notes (Signed)
09/17/2022 Name: Jose Roberts MRN: [id number] DOB: 1946-04-04  Chief Complaint  Patient presents with   Hypertension   Hyperlipidemia   Diabetes    Jose Roberts is a 77 y.o. year old male who presented for a telephone visit.   He was identified by the Clinical Pharmacist Practitioner by a quality report for assistance in managing diabetes, hypertension, hyperlipidemia, and complex medication management.   Care Team: Primary Care Provider: Shelda Pal, DO ; Next Scheduled Visit: 11/10/2022 [facility] - next appt 10/02/2022  Medication Access/Adherence  Current Pharmacy:  [facility] [address] [place], [place] - [address] [place] [address] Phone: [phone] Fax: [phone]  [facility] ([place], [place] [facility] [address] [place] [address] [place] [address] Phone: [phone] Fax: [place], [place] [address] [place] [address] Phone: [phone] Fax: [phone]  [email or web address] [address], [place] [address] Phone: [phone] Fax: [phone]  [place], [address] [place] [address] Phone: [phone] Fax: [phone]  [place], [place] [address] Phone: [phone] Fax: [phone]   Patient reports affordability concerns with their medications:  previously in 2023 but this year most if his medications are $0 copay ; Patient failed MAC measure in 2023.  Patient reports access/transportation concerns to their pharmacy: Yes  patient is legally blind since 77 yo Patient reports adherence concerns with their medications:  Yes  He has been suing  35 units of Levemir instead of 38 units because he knows that [employer] is going to stop making Levemir and he was trying to make Levemir last until his next appointment with PCP.    Diabetes:  Current medications:  Metformin ER 500mg , pioglitazone 30mg  and Levemir insulin once a day  Current glucose readings: 124, 126 Using prodigy voice / talking meter; testing 1 time daily in the morning   Patient denies hypoglycemic s/sx including no dizziness, shakiness, sweating. Patient reports hyperglycemic symptoms including no polyuria, polydipsia, polyphagia, nocturia or blurred vision but he has experience some numbness in both feet recently.    Hypertension:  Current medications: valsartan and amlodipine   BP Readings from Last 3 Encounters:  09/08/22 (!) 171/75  05/02/22 138/60  10/18/21 132/80    Patient has a validated, automated, upper arm home BP cuff Current blood pressure readings readings: 128/78 (last week) He is only checking blood pressure at home about once per week  Patient denies hypotensive s/sx including no dizziness, lightheadedness.  Patient denies hypertensive symptoms including no headache, chest pain, shortness of breath   Hyperlipidemia/ASCVD Risk Reduction  Current lipid lowering medications: pravastatin   Medications tried in the past: simvastatin and simvastatin + Ezetimibe (Vytorin)  Antiplatelet regimen: none  ASCVD History: none Risk Factors: DM, hypertension   Medication Management:  Current adherence strategy:  none  Patient reports Fair adherence to medications  Patient reports the following barriers to adherence:  Blindness / vision impairment Complex medication regimen Difficult to administer medication due to vision impairment. Independent pausing, stopping or controlling of the medication. Lack of knowledge and confidence in self-management. Low understanding of medications  benefits. Lack of transportation Lack of health  insurance / under insured - improved for 2024  Recent fill dates:  Patient has filled all maintenance medications in 2024 and should have enough on hand to last until 11/10/2022 when he sees PCP except for Levemir.    Objective:  Lab Results  Component Value Date   HGBA1C 7.6 (H) 05/02/2022    Lab Results  Component Value Date   CREATININE 1.20 09/08/2022   BUN 13 09/08/2022   NA 136 09/08/2022   K 3.5 09/08/2022   CL 101 09/08/2022   CO2 25 09/08/2022    Lab Results  Component Value Date   CHOL 155 05/02/2022   HDL 59.50 05/02/2022   LDLCALC 81 05/02/2022   TRIG 72.0 05/02/2022   CHOLHDL 3 05/02/2022    Medications Reviewed Today     Reviewed by Cherre Robins, RPH-CPP (Pharmacist) on 09/17/22 at 57  Med List Status: <None>   Medication Order Taking? Sig Documenting Provider Last Dose Status Informant  amLODipine (NORVASC) 10 MG tablet [id number] Yes Take 1 tablet (10 mg total) by mouth daily. Shelda Pal, DO Taking Active   glucose blood (PRODIGY NO CODING BLOOD GLUC) test strip [id number] Yes USE TO CHECK BLOOD SUGAR 3  TIMES DAILY Shelda Pal, DO Taking Active   hydrochlorothiazide (HYDRODIURIL) 25 MG tablet [id number] Yes Take 1 tablet (25 mg total) by mouth daily. Shelda Pal, DO Taking Active   Insulin Pen Needle (PEN NEEDLES) 32G X 4 MM MISC [id number] Yes Inject 35 Units as directed daily. To use with levemir Shelda Pal, DO Taking Active   LEVEMIR FLEXPEN 100 UNIT/ML FlexPen [id number] Yes INJECT SUBCUTANEOUSLY 38 UNITS  DAILY  Patient taking differently: Inject 35 Units into the skin daily.   Shelda Pal, DO Taking Active   metFORMIN (GLUCOPHAGE-XR) 500 MG 24 hr tablet [id number] Yes TAKE 2 TABLETS BY MOUTH TWICE DAILY WITH BREAKFAST AND WITH SUPPER Shelda Pal, DO Taking Active   Multiple Vitamin (MULTIVITAMIN WITH MINERALS) TABS [id number] Yes Take 1 tablet by mouth daily. [provider] Taking Active Self  pioglitazone (ACTOS) 30 MG tablet [id number] Yes Take 1 tablet (30 mg total) by mouth daily. Shelda Pal, DO Taking Active   pravastatin (PRAVACHOL) 40 MG tablet [id number] Yes Take 1 tablet (40 mg total) by mouth daily. Shelda Pal, DO Taking Active   sildenafil (VIAGRA) 100 MG tablet [id number] Yes Take 1 tablet (100 mg total) by mouth as needed for erectile dysfunction. Shelda Pal, DO Taking Active   valsartan (DIOVAN) 160 MG tablet [id number] Yes Take 1 tablet (160 mg total) by mouth daily. Shelda Pal, DO Taking Active             Assessment/Plan:   Diabetes: A1c not at goal. Patient asked about once weekly injection because it feels it would be easier for him to administer.  - Reviewed goal A1c, goal fasting, and goal 2 hour post prandial glucose - Recommend to continue current medication for diabetes -Discussed changing to either [place] or could also try GLP1 agent since Levemir will no longer be available.  Screened for medication assistance program - patient report his household income is very close to the 2024 cut off for Ozempic which is about $78,800. Reviewed patient's medical plan and he could get Ozempic for $47 / month but would hit coverage gap in July and cost would increase to around $246.  recommend changing to [place] when he  complete current supply of Levemir. Would lower dose of Tresiba a little to 30 units and adjust as needed.  - Recommend to check glucose daily each morning but to add checking in evening or after a meal 1 or 2 times per week.  - Will notify patient's PCP about recent neuropathy symptoms to either address at March appointment or move appointment sooner.   Hypertension:  office blood pressure is usually at goal of  130/80 but blood pressure was elevated when he was in ED 09/08/2022 - Reviewed long term cardiovascular and renal outcomes of uncontrolled blood pressure - Reviewed  appropriate blood pressure monitoring technique and reviewed goal blood pressure. Recommended to check home blood pressure and heart rate 1 or 2 times per week. - Recommend to continue valsartan and amlodipine   Hyperlipidemia/ASCVD Risk Reduction: Last LDL was at goal but patient had low adhernece for statin in 2023. - Reviewed long term complications of uncontrolled cholesterol - Recommend to continue pravastatin daily - will follow adherence in 2024.     Medication Management: - Adherence has improved since patient's new Medicare plan has lower or no copay for 2024.  - Discussed collaboration with local pharmacies for adherence packaging or home delivery. Patient is considering these options.     Health Maintenance - Discussed CDC/ACIP recommendations for TDap and Shingrix vaccine(s). Patient is planning to get these vaccines at the [facility] when he is in the building in March 2024.   Follow Up Plan: 2 to 3 months to monitor adherence and BP  Cherre Robins, PharmD Clinical Pharmacist [facility]

## 2022-10-02 DIAGNOSIS — K610 Anal abscess: Secondary | ICD-10-CM | POA: Diagnosis not present

## 2022-10-02 DIAGNOSIS — Z9889 Other specified postprocedural states: Secondary | ICD-10-CM | POA: Diagnosis not present

## 2022-10-02 DIAGNOSIS — K5900 Constipation, unspecified: Secondary | ICD-10-CM | POA: Diagnosis not present

## 2022-10-03 ENCOUNTER — Encounter: Payer: Self-pay | Admitting: Gastroenterology

## 2022-10-18 ENCOUNTER — Ambulatory Visit (INDEPENDENT_AMBULATORY_CARE_PROVIDER_SITE_OTHER): Payer: Medicare Other | Admitting: Family Medicine

## 2022-10-18 VITALS — BP 136/80 | HR 101 | Temp 98.0°F | Resp 16 | Ht 71.0 in | Wt 265.0 lb

## 2022-10-18 DIAGNOSIS — I10 Essential (primary) hypertension: Secondary | ICD-10-CM | POA: Diagnosis not present

## 2022-10-18 DIAGNOSIS — M461 Sacroiliitis, not elsewhere classified: Secondary | ICD-10-CM

## 2022-10-18 DIAGNOSIS — E1169 Type 2 diabetes mellitus with other specified complication: Secondary | ICD-10-CM

## 2022-10-18 DIAGNOSIS — E669 Obesity, unspecified: Secondary | ICD-10-CM | POA: Diagnosis not present

## 2022-10-18 MED ORDER — METHYLPREDNISOLONE 4 MG PO TABS: 4.0000 mg | ORAL_TABLET | Freq: Every day | ORAL | 0 refills | Status: DC

## 2022-10-18 MED ORDER — TIZANIDINE HCL 2 MG PO TABS: 1.0000 mg | ORAL_TABLET | Freq: Three times a day (TID) | ORAL | 1 refills | Status: DC | PRN

## 2022-10-18 NOTE — Patient Instructions (Addendum)
Tylenol/Acetaminophen ES 500 mg tabs can take up to 2 tabs three x daily (max of 3000 mg in 24 hours) Advil/Motrin/Ibuprofen 200 mg tabs can take up to 2 tabs three daily and can alternate with Tylenol as needed Do not take the Advil until you are done with the Sterdoid(methylprednisolone)  Moist heat and Lidocaine patches   Sacroiliac Joint Dysfunction  Sacroiliac joint dysfunction is a condition that causes inflammation on one or both sides of the sacroiliac (SI) joint. The SI joint is the joint between two bones of the pelvis called the sacrum and the ilium. The sacrum is the bone at the base of the spine. The ilium is the large bone that forms the hip. This condition causes deep aching or burning pain in the low back. In some cases, the pain may also spread into one or both buttocks, hips, or thighs. What are the causes? This condition may be caused by: Pregnancy. During pregnancy, extra stress is put on the SI joints because the pelvis widens. Injury, such as: Injuries from car crashes. Sports-related injuries. Work-related injuries. Having one leg that is shorter than the other. Conditions that affect the joints, such as: Rheumatoid arthritis. Gout. Psoriatic arthritis. Joint infection (septic arthritis). Sometimes, the cause of SI joint dysfunction is not known. What are the signs or symptoms? Symptoms of this condition include: Aching or burning pain in the lower back. The pain may also spread to other areas, such as: Buttocks. Groin. Thighs. Muscle spasms in or around the painful areas. Increased pain when standing, walking, running, stair climbing, bending, or lifting. How is this diagnosed? This condition is diagnosed with a physical exam and your medical history. During the exam, the health care provider may move one or both of your legs to different positions to check for pain. Various tests may be done to confirm the diagnosis, including: Imaging tests to look for  other causes of pain. These may include: MRI. CT scan. Bone scan. Diagnostic injection. A numbing medicine is injected into the SI joint using a needle. If your pain is temporarily improved or stopped after the injection, this can indicate that SI joint dysfunction is the problem. How is this treated? Treatment depends on the cause and severity of your condition. Treatment options can be noninvasive and may include: Ice or heat applied to the lower back area after an injury. This may help reduce pain and muscle spasms. Medicines to relieve pain or inflammation or to relax the muscles. Wearing a back brace (sacroiliac brace) to help support the joint while your back is healing. Physical therapy to increase muscle strength around the joint and flexibility at the joint. This may also involve learning proper body positions and ways of moving to relieve stress on the joint. Direct manipulation of the SI joint. Use of a device that provides electrical stimulation to help reduce pain at the joint. Other treatments may include: Injections of steroid medicine into the joint to reduce pain and swelling. Radiofrequency ablation. This treatment uses heat to burn away nerves that are carrying pain messages from the joint. Surgery to put in screws and plates that limit or prevent joint motion. This is rare. Follow these instructions at home: Medicines Take over-the-counter and prescription medicines only as told by your health care provider. Ask your health care provider if the medicine prescribed to you: Requires you to avoid driving or using machinery. Can cause constipation. You may need to take these actions to prevent or treat constipation: Drink enough  fluid to keep your urine pale yellow. Take over-the-counter or prescription medicines. Eat foods that are high in fiber, such as beans, whole grains, and fresh fruits and vegetables. Limit foods that are high in fat and processed sugars, such as  fried or sweet foods. If you have a brace: Wear the brace as told by your health care provider. Remove it only as told by your health care provider. Keep the brace clean. If the brace is not waterproof: Do not let it get wet. Cover it with a watertight covering when you take a bath or a shower. Managing pain, stiffness, and swelling     Icing can help with pain and swelling. Heat may help with muscle tension or spasms. Ask your health care provider if you should use ice or heat. If directed, put ice on the affected area: If you have a removable brace, remove it as told by your health care provider. Put ice in a plastic bag. Place a towel between your skin and the bag. Leave the ice on for 20 minutes, 2-3 times a day. Remove the ice if your skin turns bright red. This is very important. If you cannot feel pain, heat, or cold, you have a greater risk of damage to the area. If directed, apply heat to the affected area as often as told by your health care provider. Use the heat source that your health care provider recommends, such as a moist heat pack or a heating pad. Place a towel between your skin and the heat source. Leave the heat on for 20-30 minutes. Remove the heat if your skin turns bright red. This is especially important if you are unable to feel pain, heat, or cold. You may have a greater risk of getting burned. General instructions Rest as needed. Return to your normal activities as told by your health care provider. Ask your health care provider what activities are safe for you. Do exercises as told by your health care provider or physical therapist. Keep all follow-up visits. This is important. Contact a health care provider if: Your pain is not controlled with medicine. You have a fever. Your pain is getting worse. Get help right away if: You have weakness, numbness, or tingling in your legs or feet. You lose control of your bladder or bowels. Summary Sacroiliac (SI)  joint dysfunction is a condition that causes inflammation on one or both sides of the SI joint. This condition causes deep aching or burning pain in the low back. In some cases, the pain may also spread into one or both buttocks, hips, or thighs. Treatment depends on the cause and severity of your condition. It may include medicines to reduce pain and swelling or to relax muscles. This information is not intended to replace advice given to you by your health care provider. Make sure you discuss any questions you have with your health care provider. Document Revised: 12/10/2019 Document Reviewed: 12/10/2019 Elsevier Patient Education  [place].

## 2022-10-18 NOTE — Progress Notes (Signed)
Subjective:   By signing my name below, I, Kellie Simmering, attest that this documentation has been prepared under the direction and in the presence of Mosie Lukes, MD., 10/18/2022.   Patient ID: Jose Roberts, male    DOB: 1946/03/05, 77 y.o.   MRN: [id number]  Chief Complaint  Patient presents with   Hip Pain    Here for Hip pain   HPI Patient is in today for an office visit.  Left Hip Pain He reports that 2 days ago, he started experiencing pain that originates in the left hip and radiates down the left leg. He suspects this is due to him bowling this past weekend, 10/12/2022, and then going to the gym several days later. He states that walking exacerbates the pain and is using an assisted walking device today. So far he has been taking 4 Ibuprofen tid. He denies CP/palpitations/SOB/HA/fever/chills/GI or GU symptoms.  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 02/19/2011   Hemorrhoids    Hypertension    Past Surgical History:  Procedure Laterality Date   EYE SURGERY     HEMORRHOID SURGERY     revision   RETINAL DETACHMENT SURGERY     surgery on gangrene  May 6/7/8, 2012 (x 3 debridements)   Fournier's Gangrene of perineum    Family History  Problem Relation Age of Onset   Diabetes Mother    Hypertension Mother    Cancer Mother        pancreas   Diabetes Father    Hypertension Father    Diabetes Brother    Diabetes Sister     Social History   Socioeconomic History   Marital status: Married    Spouse name: Not on file   Number of children: Not on file   Years of education: Not on file   Highest education level: Not on file  Occupational History   Not on file  Tobacco Use   Smoking status: Never   Smokeless tobacco: Never  Substance and Sexual Activity   Alcohol use: Yes    Alcohol/week: 12.0 standard drinks of alcohol    Types: 12 Cans of beer per week    Comment: per  week   Drug use: No   Sexual activity: Not Currently  Other Topics Concern   Not on file  Social History Narrative   Not on file   Social Determinants of Health   Financial Resource Strain: Low Risk  (08/11/2021)   Overall Financial Resource Strain (CARDIA)    Difficulty of Paying Living Expenses: Not hard at all  Food Insecurity: No Food Insecurity (09/13/2022)   Hunger Vital Sign    Worried About Running Out of Food in the Last Year: Never true    [place] in the Last Year: Never true  Transportation Needs: No Transportation Needs (09/13/2022)   PRAPARE - Hydrologist (Medical): No    Lack of Transportation (Non-Medical): No  Physical Activity: Inactive (08/11/2021)   Exercise Vital Sign    Days of Exercise per Week: 0 days    Minutes of Exercise per Session: 0 min  Stress: No Stress Concern Present (08/11/2021)   [place]    Feeling of Stress : Not at all  Social Connections: [place] (08/11/2021)   Social Connection  and Isolation Panel [NHANES]    Frequency of Communication with Friends and Family: More than three times a week    Frequency of Social Gatherings with Friends and Family: More than three times a week    Attends Religious Services: More than 4 times per year    Active Member of [employer] or Organizations: Yes    Attends Music therapist: More than 4 times per year    Marital Status: Married  Human resources officer Violence: Not At Risk (09/13/2022)   Humiliation, Afraid, Rape, and Kick questionnaire    Fear of Current or Ex-Partner: No    Emotionally Abused: No    Physically Abused: No    Sexually Abused: No    Outpatient Medications Prior to Visit  Medication Sig Dispense Refill   amLODipine (NORVASC) 10 MG tablet Take 1 tablet (10 mg total) by mouth daily. 90 tablet 1   glucose blood (PRODIGY NO CODING BLOOD GLUC) test strip USE TO CHECK BLOOD  SUGAR 3  TIMES DAILY 300 strip 3   hydrochlorothiazide (HYDRODIURIL) 25 MG tablet Take 1 tablet (25 mg total) by mouth daily. 90 tablet 1   Insulin Pen Needle (PEN NEEDLES) 32G X 4 MM MISC Inject 35 Units as directed daily. To use with levemir 90 each 0   LEVEMIR FLEXPEN 100 UNIT/ML FlexPen INJECT SUBCUTANEOUSLY 38 UNITS  DAILY (Patient taking differently: Inject 35 Units into the skin daily.) 45 mL 3   metFORMIN (GLUCOPHAGE-XR) 500 MG 24 hr tablet TAKE 2 TABLETS BY MOUTH TWICE DAILY WITH BREAKFAST AND WITH SUPPER 360 tablet 0   Multiple Vitamin (MULTIVITAMIN WITH MINERALS) TABS Take 1 tablet by mouth daily.     pioglitazone (ACTOS) 30 MG tablet Take 1 tablet (30 mg total) by mouth daily. 90 tablet 1   pravastatin (PRAVACHOL) 40 MG tablet Take 1 tablet (40 mg total) by mouth daily. 90 tablet 1   sildenafil (VIAGRA) 100 MG tablet Take 1 tablet (100 mg total) by mouth as needed for erectile dysfunction. 30 tablet 2   valsartan (DIOVAN) 160 MG tablet Take 1 tablet (160 mg total) by mouth daily. 90 tablet 1   No facility-administered medications prior to visit.    No Known Allergies  Review of Systems  Constitutional:  Negative for chills and fever.  Respiratory:  Negative for shortness of breath.   Cardiovascular:  Negative for chest pain and palpitations.  Gastrointestinal:  Negative for abdominal pain, blood in stool, constipation, diarrhea, nausea and vomiting.  Genitourinary:  Negative for dysuria, frequency, hematuria and urgency.  Musculoskeletal:        (+) left hip pain.  Skin:           Neurological:  Negative for headaches.      Objective:    Physical Exam Constitutional:      General: He is not in acute distress.    Appearance: Normal appearance. He is normal weight. He is not ill-appearing.  HENT:     Head: Normocephalic and atraumatic.     Right Ear: External ear normal.     Left Ear: External ear normal.     Nose: Nose normal.     Mouth/Throat:     Mouth: Mucous  membranes are moist.     Pharynx: Oropharynx is clear.  Eyes:     General:        Right eye: No discharge.        Left eye: No discharge.     Extraocular Movements: Extraocular movements  intact.     Conjunctiva/sclera: Conjunctivae normal.     Pupils: Pupils are equal, round, and reactive to light.  Cardiovascular:     Rate and Rhythm: Normal rate and regular rhythm.     Pulses: Normal pulses.     Heart sounds: Normal heart sounds. No murmur heard.    No gallop.  Pulmonary:     Effort: Pulmonary effort is normal. No respiratory distress.     Breath sounds: Normal breath sounds. No wheezing or rales.  Abdominal:     General: Bowel sounds are normal.     Palpations: Abdomen is soft.     Tenderness: There is no abdominal tenderness. There is no guarding.  Musculoskeletal:        General: Normal range of motion.     Cervical back: Normal range of motion.     Right lower leg: No edema.     Left lower leg: No edema.  Skin:    General: Skin is warm and dry.  Neurological:     Mental Status: He is alert and oriented to person, place, and time.  Psychiatric:        Mood and Affect: Mood normal.        Behavior: Behavior normal.        Judgment: Judgment normal.     BP 136/80 (BP Location: Right Arm, Patient Position: Sitting, Cuff Size: Normal)   Pulse (!) 101   Temp 98 F (36.7 C) (Oral)   Resp 16   Ht 5\' 11"  (1.803 m)   Wt 265 lb (120.2 kg)   SpO2 95%   BMI 36.96 kg/m  Wt Readings from Last 3 Encounters:  10/18/22 265 lb (120.2 kg)  05/02/22 266 lb 9.6 oz (120.9 kg)  10/18/21 267 lb 2 oz (121.2 kg)    Diabetic Foot Exam - Simple   No data filed    Lab Results  Component Value Date   WBC 13.8 (H) 09/08/2022   HGB 14.4 09/08/2022   HCT 44.6 09/08/2022   PLT 214 09/08/2022   GLUCOSE 190 (H) 09/08/2022   CHOL 155 05/02/2022   TRIG 72.0 05/02/2022   HDL 59.50 05/02/2022   LDLCALC 81 05/02/2022   ALT 14 09/08/2022   AST 15 09/08/2022   NA 136 09/08/2022   K  3.5 09/08/2022   CL 101 09/08/2022   CREATININE 1.20 09/08/2022   BUN 13 09/08/2022   CO2 25 09/08/2022   HGBA1C 7.6 (H) 05/02/2022   MICROALBUR 1.8 05/02/2022    No results found for: "TSH" Lab Results  Component Value Date   WBC 13.8 (H) 09/08/2022   HGB 14.4 09/08/2022   HCT 44.6 09/08/2022   MCV 84.5 09/08/2022   PLT 214 09/08/2022   Lab Results  Component Value Date   NA 136 09/08/2022   K 3.5 09/08/2022   CO2 25 09/08/2022   GLUCOSE 190 (H) 09/08/2022   BUN 13 09/08/2022   CREATININE 1.20 09/08/2022   BILITOT 1.0 09/08/2022   ALKPHOS 46 09/08/2022   AST 15 09/08/2022   ALT 14 09/08/2022   PROT 7.0 09/08/2022   ALBUMIN 3.5 09/08/2022   CALCIUM 9.3 09/08/2022   ANIONGAP 10 09/08/2022   GFR 55.01 (L) 05/02/2022   Lab Results  Component Value Date   CHOL 155 05/02/2022   Lab Results  Component Value Date   HDL 59.50 05/02/2022   Lab Results  Component Value Date   LDLCALC 81 05/02/2022   Lab Results  Component  Value Date   TRIG 72.0 05/02/2022   Lab Results  Component Value Date   CHOLHDL 3 05/02/2022   Lab Results  Component Value Date   HGBA1C 7.6 (H) 05/02/2022      Assessment & Plan:  Left Hip Pain: Recommended moist heat, Lidocaine patches, Advil 200 mg, and Extra Strength Tylenol 500 mg. Prescribed Methylprednisolone 4 mg and Tizanidine 2 mg. Problem List Items Addressed This Visit     Diabetes mellitus type 2 in obese ([place])    Minimize simple carbohydrate intake while on Medrol      Essential hypertension - Primary    Well controlled, no changes to meds. Encouraged heart healthy diet such as the DASH diet and exercise as tolerated.        Sacroiliitis ([place])    No fall or trauma but having trouble with even simple position changes at this time. Gets sharp pain at left buttock level with some radiation. Given a short 5 day Medrol dosepak, TIzanidine 1-4 mg po prn. Encouraged moist heat and topical lidocaine patches. Report if no  improvement for consideration of imaging and referral.       Relevant Medications   methylPREDNISolone (MEDROL) 4 MG tablet   tiZANidine (ZANAFLEX) 2 MG tablet   Meds ordered this encounter  Medications   methylPREDNISolone (MEDROL) 4 MG tablet    Sig: Take 1 tablet (4 mg total) by mouth daily.    Dispense:  15 tablet    Refill:  0   tiZANidine (ZANAFLEX) 2 MG tablet    Sig: Take 0.5-2 tablets (1-4 mg total) by mouth every 8 (eight) hours as needed for muscle spasms.    Dispense:  40 tablet    Refill:  1   I, Penni Homans, MD, personally preformed the services described in this documentation.  All medical record entries made by the scribe were at my direction and in my presence.  I have reviewed the chart and discharge instructions (if applicable) and agree that the record reflects my personal performance and is accurate and complete. 10/18/2022  I,Mohammed Iqbal,acting as a scribe for Penni Homans, MD.,have documented all relevant documentation on the behalf of Penni Homans, MD,as directed by  Penni Homans, MD while in the presence of Penni Homans, MD.  Penni Homans, MD

## 2022-10-19 DIAGNOSIS — M461 Sacroiliitis, not elsewhere classified: Secondary | ICD-10-CM | POA: Insufficient documentation

## 2022-10-19 NOTE — Assessment & Plan Note (Signed)
No fall or trauma but having trouble with even simple position changes at this time. Gets sharp pain at left buttock level with some radiation. Given a short 5 day Medrol dosepak, TIzanidine 1-4 mg po prn. Encouraged moist heat and topical lidocaine patches. Report if no improvement for consideration of imaging and referral.

## 2022-10-19 NOTE — Assessment & Plan Note (Signed)
Well controlled, no changes to meds. Encouraged heart healthy diet such as the DASH diet and exercise as tolerated.  °

## 2022-10-19 NOTE — Assessment & Plan Note (Addendum)
Minimize simple carbohydrate intake while on Medrol

## 2022-10-24 ENCOUNTER — Telehealth: Payer: Self-pay | Admitting: Family Medicine

## 2022-10-24 MED ORDER — LEVEMIR FLEXPEN 100 UNIT/ML ~~LOC~~ SOPN: 38.0000 [IU] | PEN_INJECTOR | Freq: Every day | SUBCUTANEOUS | 0 refills | Status: DC

## 2022-10-24 NOTE — Addendum Note (Signed)
Addended by: Sharon Seller B on: 10/24/2022 11:22 AM   Modules accepted: Orders

## 2022-10-24 NOTE — Telephone Encounter (Signed)
Pt states his insulin got in water and now the pens are not working. He is completely out and needing a refill as soon as possible. He asked if someone could call him and let him know when rx is called in.    [facility] [address] [place], [place] - [address], [place] [address] Phone: [phone]  Fax: [phone]

## 2022-10-24 NOTE — Telephone Encounter (Signed)
Sent in

## 2022-10-26 ENCOUNTER — Encounter: Payer: Self-pay | Admitting: Family Medicine

## 2022-10-26 ENCOUNTER — Ambulatory Visit (INDEPENDENT_AMBULATORY_CARE_PROVIDER_SITE_OTHER): Payer: Medicare Other | Admitting: Family Medicine

## 2022-10-26 VITALS — BP 144/82 | HR 92 | Temp 98.2°F | Ht 71.0 in | Wt 252.0 lb

## 2022-10-26 DIAGNOSIS — M5432 Sciatica, left side: Secondary | ICD-10-CM | POA: Diagnosis not present

## 2022-10-26 MED ORDER — TRAMADOL HCL 50 MG PO TABS: 50.0000 mg | ORAL_TABLET | Freq: Four times a day (QID) | ORAL | 0 refills | Status: DC | PRN

## 2022-10-26 MED ORDER — KETOROLAC TROMETHAMINE 60 MG/2ML IM SOLN
60.0000 mg | Freq: Once | INTRAMUSCULAR | Status: AC
  Administered 2022-10-26: 60 mg via INTRAMUSCULAR

## 2022-10-26 NOTE — Progress Notes (Signed)
Established Patient Office Visit   Subjective:  Patient ID: Jose Roberts, male    DOB: [birth year]  Age: 77 y.o. MRN: [id number]  Chief Complaint  Patient presents with   Back Pain    Lower back pains spreading left thigh x 10 days.    Back Pain Pertinent negatives include no abdominal pain, tingling or weakness.   Encounter Diagnoses  Name Primary?   Sciatica of left side Yes   10-day history now lower back pain that is predominantly on the left.  The pain has been moving down into the thigh and down the back of the leg.  Patient denies weakness, bowel or bladder incontinence or saddle paresthesias.  Seen by primary and given the Medrol Dosepak along with tizanidine.  Symptoms have progressed.  Has follow-up scheduled with his primary on Wednesday.  Recent history of perirectal abscess that is resolving he tells me.  No fevers or chills.   Review of Systems  Constitutional: Negative.   HENT: Negative.    Eyes:  Negative for blurred vision, discharge and redness.  Respiratory: Negative.    Cardiovascular: Negative.   Gastrointestinal:  Negative for abdominal pain.  Genitourinary: Negative.   Musculoskeletal:  Positive for back pain. Negative for myalgias.  Skin:  Negative for rash.  Neurological:  Negative for tingling, loss of consciousness and weakness.  Endo/Heme/Allergies:  Negative for polydipsia.     Current Outpatient Medications:    amLODipine (NORVASC) 10 MG tablet, Take 1 tablet (10 mg total) by mouth daily., Disp: 90 tablet, Rfl: 1   glucose blood (PRODIGY NO CODING BLOOD GLUC) test strip, USE TO CHECK BLOOD SUGAR 3  TIMES DAILY, Disp: 300 strip, Rfl: 3   hydrochlorothiazide (HYDRODIURIL) 25 MG tablet, Take 1 tablet (25 mg total) by mouth daily., Disp: 90 tablet, Rfl: 1   insulin detemir (LEVEMIR FLEXPEN) 100 UNIT/ML FlexPen, Inject 38 Units into the skin daily. INJECT SUBCUTANEOUSLY 38 UNITS  DAILY Strength: 100 UNIT/ML, Disp: 15 mL, Rfl: 0   Insulin Pen  Needle (PEN NEEDLES) 32G X 4 MM MISC, Inject 35 Units as directed daily. To use with levemir, Disp: 90 each, Rfl: 0   metFORMIN (GLUCOPHAGE-XR) 500 MG 24 hr tablet, TAKE 2 TABLETS BY MOUTH TWICE DAILY WITH BREAKFAST AND WITH SUPPER, Disp: 360 tablet, Rfl: 0   Multiple Vitamin (MULTIVITAMIN WITH MINERALS) TABS, Take 1 tablet by mouth daily., Disp: , Rfl:    pioglitazone (ACTOS) 30 MG tablet, Take 1 tablet (30 mg total) by mouth daily., Disp: 90 tablet, Rfl: 1   pravastatin (PRAVACHOL) 40 MG tablet, Take 1 tablet (40 mg total) by mouth daily., Disp: 90 tablet, Rfl: 1   sildenafil (VIAGRA) 100 MG tablet, Take 1 tablet (100 mg total) by mouth as needed for erectile dysfunction., Disp: 30 tablet, Rfl: 2   tiZANidine (ZANAFLEX) 2 MG tablet, Take 0.5-2 tablets (1-4 mg total) by mouth every 8 (eight) hours as needed for muscle spasms., Disp: 40 tablet, Rfl: 1   traMADol (ULTRAM) 50 MG tablet, Take 1 tablet (50 mg total) by mouth every 6 (six) hours as needed for up to 5 days., Disp: 15 tablet, Rfl: 0   valsartan (DIOVAN) 160 MG tablet, Take 1 tablet (160 mg total) by mouth daily., Disp: 90 tablet, Rfl: 1   Objective:     BP (!) 144/82 (BP Location: Left Arm, Patient Position: Sitting, Cuff Size: Large)   Pulse 92   Temp 98.2 F (36.8 C) (Temporal)   Ht 5\' 11"  (  1.803 m)   Wt 252 lb (114.3 kg)   SpO2 99%   BMI 35.15 kg/m    Physical Exam Constitutional:      General: He is not in acute distress.    Appearance: Normal appearance. He is not ill-appearing, toxic-appearing or diaphoretic.  HENT:     Head: Normocephalic and atraumatic.     Right Ear: External ear normal.     Left Ear: External ear normal.  Eyes:     General: No scleral icterus.       Right eye: No discharge.        Left eye: No discharge.     Extraocular Movements: Extraocular movements intact.     Conjunctiva/sclera: Conjunctivae normal.  Pulmonary:     Effort: Pulmonary effort is normal. No respiratory distress.   Musculoskeletal:     Lumbar back: No tenderness or bony tenderness. Normal range of motion. Negative right straight leg raise test and negative left straight leg raise test.  Skin:    General: Skin is warm and dry.  Neurological:     Mental Status: He is alert and oriented to person, place, and time.     Motor: No weakness.     Deep Tendon Reflexes:     Reflex Scores:      Patellar reflexes are 1+ on the right side and 1+ on the left side.      Achilles reflexes are 1+ on the right side and 1+ on the left side. Psychiatric:        Mood and Affect: Mood normal.        Behavior: Behavior normal.      No results found for any visits on 10/26/22.    The 10-year ASCVD risk score (Arnett DK, et al., 2019) is: 40.8%    Assessment & Plan:   Sciatica of left side -     Ketorolac Tromethamine -     traMADol HCl; Take 1 tablet (50 mg total) by mouth every 6 (six) hours as needed for up to 5 days.  Dispense: 15 tablet; Refill: 0    No follow-ups on file.   Ketorolac injection given today.  Will try tramadol for pain relief.  Information on sciatica was given.  Will follow-up with primary on Wednesday. Libby Maw, MD

## 2022-10-31 ENCOUNTER — Other Ambulatory Visit: Payer: Self-pay | Admitting: Family Medicine

## 2022-10-31 ENCOUNTER — Ambulatory Visit (INDEPENDENT_AMBULATORY_CARE_PROVIDER_SITE_OTHER): Payer: Medicare Other | Admitting: Family Medicine

## 2022-10-31 ENCOUNTER — Encounter: Payer: Self-pay | Admitting: Family Medicine

## 2022-10-31 VITALS — BP 138/82 | HR 96 | Temp 97.7°F | Ht 71.0 in | Wt 250.0 lb

## 2022-10-31 DIAGNOSIS — E1140 Type 2 diabetes mellitus with diabetic neuropathy, unspecified: Secondary | ICD-10-CM | POA: Diagnosis not present

## 2022-10-31 DIAGNOSIS — Z794 Long term (current) use of insulin: Secondary | ICD-10-CM | POA: Diagnosis not present

## 2022-10-31 DIAGNOSIS — I10 Essential (primary) hypertension: Secondary | ICD-10-CM | POA: Diagnosis not present

## 2022-10-31 DIAGNOSIS — E669 Obesity, unspecified: Secondary | ICD-10-CM

## 2022-10-31 DIAGNOSIS — M9905 Segmental and somatic dysfunction of pelvic region: Secondary | ICD-10-CM | POA: Diagnosis not present

## 2022-10-31 DIAGNOSIS — M5432 Sciatica, left side: Secondary | ICD-10-CM | POA: Diagnosis not present

## 2022-10-31 DIAGNOSIS — E876 Hypokalemia: Secondary | ICD-10-CM

## 2022-10-31 DIAGNOSIS — E1169 Type 2 diabetes mellitus with other specified complication: Secondary | ICD-10-CM | POA: Diagnosis not present

## 2022-10-31 DIAGNOSIS — M9902 Segmental and somatic dysfunction of thoracic region: Secondary | ICD-10-CM | POA: Diagnosis not present

## 2022-10-31 DIAGNOSIS — M9901 Segmental and somatic dysfunction of cervical region: Secondary | ICD-10-CM | POA: Diagnosis not present

## 2022-10-31 DIAGNOSIS — M9903 Segmental and somatic dysfunction of lumbar region: Secondary | ICD-10-CM | POA: Diagnosis not present

## 2022-10-31 LAB — HEMOGLOBIN A1C: Hgb A1c MFr Bld: 7.6 % — ABNORMAL HIGH (ref 4.6–6.5)

## 2022-10-31 LAB — LIPID PANEL
Cholesterol: 128 mg/dL (ref 0–200)
HDL: 51.1 mg/dL (ref >39.00)
LDL Cholesterol: 64 mg/dL (ref 0–99)
NonHDL: 76.57
Total CHOL/HDL Ratio: 2
Triglycerides: 64 mg/dL (ref 0.0–149.0)
VLDL: 12.8 mg/dL (ref 0.0–40.0)

## 2022-10-31 LAB — COMPREHENSIVE METABOLIC PANEL
ALT: 10 U/L (ref 0–53)
AST: 12 U/L (ref 0–37)
Albumin: 4.1 g/dL (ref 3.5–5.2)
Alkaline Phosphatase: 50 U/L (ref 39–117)
BUN: 15 mg/dL (ref 6–23)
CO2: 25 mEq/L (ref 19–32)
Calcium: 10.1 mg/dL (ref 8.4–10.5)
Chloride: 99 mEq/L (ref 96–112)
Creatinine, Ser: 1.04 mg/dL (ref 0.40–1.50)
GFR: 70.33 mL/min (ref >60.00)
Glucose, Bld: 134 mg/dL — ABNORMAL HIGH (ref 70–99)
Potassium: 3.4 mEq/L — ABNORMAL LOW (ref 3.5–5.1)
Sodium: 136 mEq/L (ref 135–145)
Total Bilirubin: 1.1 mg/dL (ref 0.2–1.2)
Total Protein: 7.3 g/dL (ref 6.0–8.3)

## 2022-10-31 MED ORDER — POTASSIUM CHLORIDE ER 10 MEQ PO TBCR: 20.0000 meq | EXTENDED_RELEASE_TABLET | Freq: Every day | ORAL | 0 refills | Status: DC

## 2022-10-31 MED ORDER — METHYLPREDNISOLONE ACETATE 80 MG/ML IJ SUSP
80.0000 mg | Freq: Once | INTRAMUSCULAR | Status: AC
  Administered 2022-10-31: 80 mg via INTRAMUSCULAR

## 2022-10-31 MED ORDER — TRESIBA FLEXTOUCH 100 UNIT/ML ~~LOC~~ SOPN: 38.0000 [IU] | PEN_INJECTOR | Freq: Every day | SUBCUTANEOUS | 2 refills | Status: DC

## 2022-10-31 MED ORDER — TIZANIDINE HCL 2 MG PO TABS: 1.0000 mg | ORAL_TABLET | Freq: Three times a day (TID) | ORAL | 1 refills | Status: DC | PRN

## 2022-10-31 MED ORDER — VENLAFAXINE HCL ER 37.5 MG PO CP24: 37.5000 mg | ORAL_CAPSULE | Freq: Every day | ORAL | 2 refills | Status: DC

## 2022-10-31 NOTE — Progress Notes (Signed)
Subjective:   Chief Complaint  Patient presents with   Follow-up    6 month    Hip Pain    Jose Roberts is a 77 y.o. male here for follow-up of diabetes.  He is here with his brother. [name]'s self monitored glucose range is low 100's.  Patient denies hypoglycemic reactions. He checks his glucose levels 1-2 time(s) per day. Patient does require insulin.  He is on Levemir 38 units nightly. Medications include: Actos 30 mg/d, Metformin XR 1000 mg bid Diet is better.  He has lost around 15 pounds intentionally. Exercise: walking Having worsening tingling in both feet, interested in a medication to help with this.   Hypertension Patient presents for hypertension follow up. He does monitor home blood pressures. Blood pressures ranging on average from 120-130's/70's. He is compliant with medications- HCTZ 25 mg/d, Diovan 160 mg/d, Norvasc 10 mg/d. Patient has these side effects of medication: none Diet/exercise as above. No Cp or SOB.   Low back pain L sided for 2 weeks, started after bowling.  He had a Toradol injection without relief.  Tramadol for pain is not helping.  No loss of control of bowel/bladder function.  No redness, bruising, or swelling.  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 02/19/2011   Hemorrhoids    Hypertension      Related testing: Retinal exam: N/A, pt is blind Pneumovax: done  Objective:  BP 138/82 (BP Location: Right Arm, Patient Position: Sitting, Cuff Size: Large)   Pulse 96   Temp 97.7 F (36.5 C) (Oral)   Ht 5\' 11"  (1.803 m)   Wt 250 lb (113.4 kg)   SpO2 97%   BMI 34.87 kg/m  General:  Well developed, well nourished, in no apparent distress Skin:  Warm, no pallor or diaphoresis on exposed skin, no evidence of athlete's foot today Lungs:  CTAB, no access msc use Cardio:  RRR, no bruits, no LE edema Musculoskeletal: TTP over the left lumbar paraspinal  musculature, poor hamstring range of motion on the left compared to the right Neuro: DTRs equal and symmetric in the lower extremities bilaterally, no clonus, no cerebellar signs, negative straight leg bilaterally, sensation intact to pinprick on feet Psych: Age appropriate judgment and insight  Assessment:   Diabetes mellitus type 2 in obese ([place]) - Plan: Comprehensive metabolic panel, Lipid panel, Hemoglobin A1c, insulin degludec (TRESIBA FLEXTOUCH) 100 UNIT/ML FlexTouch Pen  Essential hypertension  Type 2 diabetes mellitus with diabetic neuropathy, with long-term current use of insulin ([facility]) - Plan: venlafaxine XR (EFFEXOR XR) 37.5 MG 24 hr capsule  Sciatica of left side - Plan: methylPREDNISolone acetate (DEPO-MEDROL) injection 80 mg   Plan:   Chronic, hopefully stable.  Continue metformin XR 1000 mg twice daily, Actos 30 mg daily, change Levemir to [place] 38 units daily.  This is because Levemir would no longer be reduced.  Counseled on diet and exercise. Chronic, stable.  Continue Norvasc 10 mg daily, hydrochlorothiazide 25 mg daily, Diovan 160 mg daily. Chronic, uncontrolled.  Start Effexor XR 37.5 mg daily.  Follow-up in 1 month for this. Refilled muscle relaxer.  Refer to physical therapy.  Depo injection today. F/u in 3-6 mo for physical and 1 month as above for neuropathy recheck. The patient voiced understanding and agreement to the plan.  [place], DO 10/31/22 12:08 PM

## 2022-10-31 NOTE — Patient Instructions (Addendum)
Give [place] 2-3 business days to get the results of your labs back.   Keep the diet clean and stay active.  Strong work with your weight loss.  Go to the pharmacy for a tetanus booster.   Let us know if you need anything.  EXERCISES  RANGE OF MOTION (ROM) AND STRETCHING EXERCISES - Low Back Pain Most people with lower back pain will find that their symptoms get worse with excessive bending forward (flexion) or arching at the lower back (extension). The exercises that will help resolve your symptoms will focus on the opposite motion.  If you have pain, numbness or tingling which travels down into your buttocks, leg or foot, the goal of the therapy is for these symptoms to move closer to your back and eventually resolve. Sometimes, these leg symptoms will get better, but your lower back pain may worsen. This is often an indication of progress in your rehabilitation. Be very alert to any changes in your symptoms and the activities in which you participated in the 24 hours prior to the change. Sharing this information with your caregiver will allow him or her to most efficiently treat your condition. These exercises may help you when beginning to rehabilitate your injury. Your symptoms may resolve with or without further involvement from your physician, physical therapist or athletic trainer. While completing these exercises, remember:  Restoring tissue flexibility helps normal motion to return to the joints. This allows healthier, less painful movement and activity. An effective stretch should be held for at least 30 seconds. A stretch should never be painful. You should only feel a gentle lengthening or release in the stretched tissue. FLEXION RANGE OF MOTION AND STRETCHING EXERCISES:  STRETCH - Flexion, Single Knee to Chest  Lie on a firm bed or floor with both legs extended in front of you. Keeping one leg in contact with the floor, bring your opposite knee to your chest. Hold your leg in place by  either grabbing behind your thigh or at your knee. Pull until you feel a gentle stretch in your low back. Hold 30 seconds. Slowly release your grasp and repeat the exercise with the opposite side. Repeat 2 times. Complete this exercise 3 times per week.   STRETCH - Flexion, Double Knee to Chest Lie on a firm bed or floor with both legs extended in front of you. Keeping one leg in contact with the floor, bring your opposite knee to your chest. Tense your stomach muscles to support your back and then lift your other knee to your chest. Hold your legs in place by either grabbing behind your thighs or at your knees. Pull both knees toward your chest until you feel a gentle stretch in your low back. Hold 30 seconds. Tense your stomach muscles and slowly return one leg at a time to the floor. Repeat 2 times. Complete this exercise 3 times per week.   STRETCH - Low Trunk Rotation Lie on a firm bed or floor. Keeping your legs in front of you, bend your knees so they are both pointed toward the ceiling and your feet are flat on the floor. Extend your arms out to the side. This will stabilize your upper body by keeping your shoulders in contact with the floor. Gently and slowly drop both knees together to one side until you feel a gentle stretch in your low back. Hold for 30 seconds. Tense your stomach muscles to support your lower back as you bring your knees back to the starting  position. Repeat the exercise to the other side. Repeat 2 times. Complete this exercise at least 3 times per week.   EXTENSION RANGE OF MOTION AND FLEXIBILITY EXERCISES:  STRETCH - Extension, Prone on Elbows  Lie on your stomach on the floor, a bed will be too soft. Place your palms about shoulder width apart and at the height of your head. Place your elbows under your shoulders. If this is too painful, stack pillows under your chest. Allow your body to relax so that your hips drop lower and make contact more completely with  the floor. Hold this position for 30 seconds. Slowly return to lying flat on the floor. Repeat 2 times. Complete this exercise 3 times per week.   RANGE OF MOTION - Extension, Prone Press Ups Lie on your stomach on the floor, a bed will be too soft. Place your palms about shoulder width apart and at the height of your head. Keeping your back as relaxed as possible, slowly straighten your elbows while keeping your hips on the floor. You may adjust the placement of your hands to maximize your comfort. As you gain motion, your hands will come more underneath your shoulders. Hold this position 30 seconds. Slowly return to lying flat on the floor. Repeat 2 times. Complete this exercise 3 times per week.   RANGE OF MOTION- Quadruped, Neutral Spine  Assume a hands and knees position on a firm surface. Keep your hands under your shoulders and your knees under your hips. You may place padding under your knees for comfort. Drop your head and point your tailbone toward the ground below you. This will round out your lower back like an angry cat. Hold this position for 30 seconds. Slowly lift your head and release your tail bone so that your back sags into a large arch, like an old horse. Hold this position for 30 seconds. Repeat this until you feel limber in your low back. Now, find your "sweet spot." This will be the most comfortable position somewhere between the two previous positions. This is your neutral spine. Once you have found this position, tense your stomach muscles to support your low back. Hold this position for 30 seconds. Repeat 2 times. Complete this exercise 3 times per week.   STRENGTHENING EXERCISES - Low Back Sprain These exercises may help you when beginning to rehabilitate your injury. These exercises should be done near your "sweet spot." This is the neutral, low-back arch, somewhere between fully rounded and fully arched, that is your least painful position. When performed in this  safe range of motion, these exercises can be used for people who have either a flexion or extension based injury. These exercises may resolve your symptoms with or without further involvement from your physician, physical therapist or athletic trainer. While completing these exercises, remember:  Muscles can gain both the endurance and the strength needed for everyday activities through controlled exercises. Complete these exercises as instructed by your physician, physical therapist or athletic trainer. Increase the resistance and repetitions only as guided. You may experience muscle soreness or fatigue, but the pain or discomfort you are trying to eliminate should never worsen during these exercises. If this pain does worsen, stop and make certain you are following the directions exactly. If the pain is still present after adjustments, discontinue the exercise until you can discuss the trouble with your caregiver.  STRENGTHENING - Deep Abdominals, Pelvic Tilt  Lie on a firm bed or floor. Keeping your legs in front of  you, bend your knees so they are both pointed toward the ceiling and your feet are flat on the floor. Tense your lower abdominal muscles to press your low back into the floor. This motion will rotate your pelvis so that your tail bone is scooping upwards rather than pointing at your feet or into the floor. With a gentle tension and even breathing, hold this position for 3 seconds. Repeat 2 times. Complete this exercise 3 times per week.   STRENGTHENING - Abdominals, Crunches  Lie on a firm bed or floor. Keeping your legs in front of you, bend your knees so they are both pointed toward the ceiling and your feet are flat on the floor. Cross your arms over your chest. Slightly tip your chin down without bending your neck. Tense your abdominals and slowly lift your trunk high enough to just clear your shoulder blades. Lifting higher can put excessive stress on the lower back and does not  further strengthen your abdominal muscles. Control your return to the starting position. Repeat 2 times. Complete this exercise 3 times per week.   STRENGTHENING - Quadruped, Opposite UE/LE Lift  Assume a hands and knees position on a firm surface. Keep your hands under your shoulders and your knees under your hips. You may place padding under your knees for comfort. Find your neutral spine and gently tense your abdominal muscles so that you can maintain this position. Your shoulders and hips should form a rectangle that is parallel with the floor and is not twisted. Keeping your trunk steady, lift your right hand no higher than your shoulder and then your left leg no higher than your hip. Make sure you are not holding your breath. Hold this position for 30 seconds. Continuing to keep your abdominal muscles tense and your back steady, slowly return to your starting position. Repeat with the opposite arm and leg. Repeat 2 times. Complete this exercise 3 times per week.   STRENGTHENING - Abdominals and Quadriceps, Straight Leg Raise  Lie on a firm bed or floor with both legs extended in front of you. Keeping one leg in contact with the floor, bend the other knee so that your foot can rest flat on the floor. Find your neutral spine, and tense your abdominal muscles to maintain your spinal position throughout the exercise. Slowly lift your straight leg off the floor about 6 inches for a count of 3, making sure to not hold your breath. Still keeping your neutral spine, slowly lower your leg all the way to the floor. Repeat this exercise with each leg 2 times. Complete this exercise 3 times per week.  POSTURE AND BODY MECHANICS CONSIDERATIONS - Low Back Sprain Keeping correct posture when sitting, standing or completing your activities will reduce the stress put on different body tissues, allowing injured tissues a chance to heal and limiting painful experiences. The following are general guidelines  for improved posture.  While reading these guidelines, remember: The exercises prescribed by your provider will help you have the flexibility and strength to maintain correct postures. The correct posture provides the best environment for your joints to work. All of your joints have less wear and tear when properly supported by a spine with good posture. This means you will experience a healthier, less painful body. Correct posture must be practiced with all of your activities, especially prolonged sitting and standing. Correct posture is as important when doing repetitive low-stress activities (typing) as it is when doing a single heavy-load activity (lifting).  RESTING POSITIONS Consider which positions are most painful for you when choosing a resting position. If you have pain with flexion-based activities (sitting, bending, stooping, squatting), choose a position that allows you to rest in a less flexed posture. You would want to avoid curling into a fetal position on your side. If your pain worsens with extension-based activities (prolonged standing, working overhead), avoid resting in an extended position such as sleeping on your stomach. Most people will find more comfort when they rest with their spine in a more neutral position, neither too rounded nor too arched. Lying on a non-sagging bed on your side with a pillow between your knees, or on your back with a pillow under your knees will often provide some relief. Keep in mind, being in any one position for a prolonged period of time, no matter how correct your posture, can still lead to stiffness.  PROPER SITTING POSTURE In order to minimize stress and discomfort on your spine, you must sit with correct posture. Sitting with good posture should be effortless for a healthy body. Returning to good posture is a gradual process. Many people can work toward this most comfortably by using various supports until they have the flexibility and strength to  maintain this posture on their own. When sitting with proper posture, your ears will fall over your shoulders and your shoulders will fall over your hips. You should use the back of the chair to support your upper back. Your lower back will be in a neutral position, just slightly arched. You may place a small pillow or folded towel at the base of your lower back for  support.  When working at a desk, create an environment that supports good, upright posture. Without extra support, muscles tire, which leads to excessive strain on joints and other tissues. Keep these recommendations in mind:  CHAIR: A chair should be able to slide under your desk when your back makes contact with the back of the chair. This allows you to work closely. The chair's height should allow your eyes to be level with the upper part of your monitor and your hands to be slightly lower than your elbows.  BODY POSITION Your feet should make contact with the floor. If this is not possible, use a foot rest. Keep your ears over your shoulders. This will reduce stress on your neck and low back.  INCORRECT SITTING POSTURES  If you are feeling tired and unable to assume a healthy sitting posture, do not slouch or slump. This puts excessive strain on your back tissues, causing more damage and pain. Healthier options include: Using more support, like a lumbar pillow. Switching tasks to something that requires you to be upright or walking. Talking a brief walk. Lying down to rest in a neutral-spine position.  PROLONGED STANDING WHILE SLIGHTLY LEANING FORWARD  When completing a task that requires you to lean forward while standing in one place for a long time, place either foot up on a stationary 2-4 inch high object to help maintain the best posture. When both feet are on the ground, the lower back tends to lose its slight inward curve. If this curve flattens (or becomes too large), then the back and your other joints will experience  too much stress, tire more quickly, and can cause pain.  CORRECT STANDING POSTURES Proper standing posture should be assumed with all daily activities, even if they only take a few moments, like when brushing your teeth. As in sitting, your ears should  fall over your shoulders and your shoulders should fall over your hips. You should keep a slight tension in your abdominal muscles to brace your spine. Your tailbone should point down to the ground, not behind your body, resulting in an over-extended swayback posture.   INCORRECT STANDING POSTURES  Common incorrect standing postures include a forward head, locked knees and/or an excessive swayback. WALKING Walk with an upright posture. Your ears, shoulders and hips should all line-up.  PROLONGED ACTIVITY IN A FLEXED POSITION When completing a task that requires you to bend forward at your waist or lean over a low surface, try to find a way to stabilize 3 out of 4 of your limbs. You can place a hand or elbow on your thigh or rest a knee on the surface you are reaching across. This will provide you more stability, so that your muscles do not tire as quickly. By keeping your knees relaxed, or slightly bent, you will also reduce stress across your lower back. CORRECT LIFTING TECHNIQUES  DO : Assume a wide stance. This will provide you more stability and the opportunity to get as close as possible to the object which you are lifting. Tense your abdominals to brace your spine. Bend at the knees and hips. Keeping your back locked in a neutral-spine position, lift using your leg muscles. Lift with your legs, keeping your back straight. Test the weight of unknown objects before attempting to lift them. Try to keep your elbows locked down at your sides in order get the best strength from your shoulders when carrying an object.   Always ask for help when lifting heavy or awkward objects. INCORRECT LIFTING TECHNIQUES DO NOT:  Lock your knees when lifting,  even if it is a small object. Bend and twist. Pivot at your feet or move your feet when needing to change directions. Assume that you can safely pick up even a paperclip without proper posture.

## 2022-11-01 ENCOUNTER — Telehealth: Payer: Self-pay | Admitting: Family Medicine

## 2022-11-01 DIAGNOSIS — M9901 Segmental and somatic dysfunction of cervical region: Secondary | ICD-10-CM | POA: Diagnosis not present

## 2022-11-01 DIAGNOSIS — M9905 Segmental and somatic dysfunction of pelvic region: Secondary | ICD-10-CM | POA: Diagnosis not present

## 2022-11-01 DIAGNOSIS — M9903 Segmental and somatic dysfunction of lumbar region: Secondary | ICD-10-CM | POA: Diagnosis not present

## 2022-11-01 DIAGNOSIS — M9902 Segmental and somatic dysfunction of thoracic region: Secondary | ICD-10-CM | POA: Diagnosis not present

## 2022-11-01 NOTE — Telephone Encounter (Signed)
Pt called back to speak with Shirlean Mylar but was advised she is not in the office but please call when back in office

## 2022-11-02 NOTE — Telephone Encounter (Signed)
See result notes. 

## 2022-11-05 DIAGNOSIS — M9905 Segmental and somatic dysfunction of pelvic region: Secondary | ICD-10-CM | POA: Diagnosis not present

## 2022-11-05 DIAGNOSIS — M9901 Segmental and somatic dysfunction of cervical region: Secondary | ICD-10-CM | POA: Diagnosis not present

## 2022-11-05 DIAGNOSIS — M9902 Segmental and somatic dysfunction of thoracic region: Secondary | ICD-10-CM | POA: Diagnosis not present

## 2022-11-05 DIAGNOSIS — M9903 Segmental and somatic dysfunction of lumbar region: Secondary | ICD-10-CM | POA: Diagnosis not present

## 2022-11-07 ENCOUNTER — Other Ambulatory Visit (INDEPENDENT_AMBULATORY_CARE_PROVIDER_SITE_OTHER): Payer: Medicare Other

## 2022-11-07 ENCOUNTER — Other Ambulatory Visit: Payer: Medicare Other

## 2022-11-07 DIAGNOSIS — E876 Hypokalemia: Secondary | ICD-10-CM

## 2022-11-07 DIAGNOSIS — M9902 Segmental and somatic dysfunction of thoracic region: Secondary | ICD-10-CM | POA: Diagnosis not present

## 2022-11-07 DIAGNOSIS — M9901 Segmental and somatic dysfunction of cervical region: Secondary | ICD-10-CM | POA: Diagnosis not present

## 2022-11-07 DIAGNOSIS — M9905 Segmental and somatic dysfunction of pelvic region: Secondary | ICD-10-CM | POA: Diagnosis not present

## 2022-11-07 DIAGNOSIS — M9903 Segmental and somatic dysfunction of lumbar region: Secondary | ICD-10-CM | POA: Diagnosis not present

## 2022-11-08 DIAGNOSIS — M5451 Vertebrogenic low back pain: Secondary | ICD-10-CM | POA: Diagnosis not present

## 2022-11-08 LAB — BASIC METABOLIC PANEL
BUN: 18 mg/dL (ref 6–23)
CO2: 28 mEq/L (ref 19–32)
Calcium: 10 mg/dL (ref 8.4–10.5)
Chloride: 96 mEq/L (ref 96–112)
Creatinine, Ser: 1.29 mg/dL (ref 0.40–1.50)
GFR: 54.3 mL/min — ABNORMAL LOW (ref >60.00)
Glucose, Bld: 208 mg/dL — ABNORMAL HIGH (ref 70–99)
Potassium: 4.2 mEq/L (ref 3.5–5.1)
Sodium: 132 mEq/L — ABNORMAL LOW (ref 135–145)

## 2022-11-12 ENCOUNTER — Other Ambulatory Visit: Payer: Medicare Other

## 2022-11-14 ENCOUNTER — Ambulatory Visit (INDEPENDENT_AMBULATORY_CARE_PROVIDER_SITE_OTHER): Payer: Medicare Other | Admitting: Pharmacist

## 2022-11-14 DIAGNOSIS — E1140 Type 2 diabetes mellitus with diabetic neuropathy, unspecified: Secondary | ICD-10-CM

## 2022-11-14 DIAGNOSIS — Z794 Long term (current) use of insulin: Secondary | ICD-10-CM

## 2022-11-14 DIAGNOSIS — E785 Hyperlipidemia, unspecified: Secondary | ICD-10-CM

## 2022-11-14 DIAGNOSIS — I10 Essential (primary) hypertension: Secondary | ICD-10-CM

## 2022-11-14 MED ORDER — VALSARTAN 160 MG PO TABS: 160.0000 mg | ORAL_TABLET | Freq: Every day | ORAL | 0 refills | Status: DC

## 2022-11-14 MED ORDER — PRAVASTATIN SODIUM 40 MG PO TABS: 40.0000 mg | ORAL_TABLET | Freq: Every day | ORAL | 0 refills | Status: DC

## 2022-11-14 MED ORDER — METFORMIN HCL ER 500 MG PO TB24: ORAL_TABLET | ORAL | 0 refills | Status: DC

## 2022-11-14 MED ORDER — HYDROCHLOROTHIAZIDE 25 MG PO TABS: 25.0000 mg | ORAL_TABLET | Freq: Every day | ORAL | 0 refills | Status: DC

## 2022-11-14 MED ORDER — AMLODIPINE BESYLATE 10 MG PO TABS: 10.0000 mg | ORAL_TABLET | Freq: Every day | ORAL | 0 refills | Status: DC

## 2022-11-14 MED ORDER — PIOGLITAZONE HCL 30 MG PO TABS: 30.0000 mg | ORAL_TABLET | Freq: Every day | ORAL | 0 refills | Status: DC

## 2022-11-14 NOTE — Progress Notes (Signed)
11/14/2022 Name: Jose Roberts MRN: [id number] DOB: 28-Jun-1946  Chief Complaint  Patient presents with   Medication Management   Diabetes   Hypertension   Hyperlipidemia    Jose Roberts is a 77 y.o. year old male who presented for a telephone visit.   He was identified by the Clinical Pharmacist Practitioner by a quality report for assistance in managing diabetes, hypertension, hyperlipidemia, and complex medication management.   Care Team: Primary Care Provider: Shelda Pal, DO ; Next Scheduled Visit: 12/03/2022 Dr Loletha Carrow - GI 11/2022  Medication Access/Adherence  Current Pharmacy:  [facility] [address] [place], [place] [address] Phone: [phone] Fax: [phone]  [facility] ([place], [place] [facility] [address] [place] [address] [place] [address] Phone: [phone] Fax: [place], [address] [place] [address] Phone: [phone] Fax: [phone]  [email or web address] [place], [place] [address] Phone: [phone] Fax: [phone]  [place], [address] [place] [address] [place] [address] Phone: [phone] Fax: [phone]  [place], [place] [address] Phone: [phone] Fax: [place] [id number] - [place], [place] - [address] AT [place] [address] [place] [address] Phone: [phone] Fax: [phone]   Patient reported affordability concerns with their medications:  previously in 2023 but this year most of his medications are $0 copay ; Patient failed MAC measure in 2023.   Patient reports access/transportation concerns to their pharmacy: Yes  patient is legally blind since 77 yo Patient reports adherence concerns with their medications:  No     Diabetes:  Current medications:  Metformin ER 500mg , pioglitazone 30mg  and Levemir insulin 38 units once a day (he will switch to [place] 38 units daily once current supply of Levemir is used)  Current glucose readings: 122 today. Ranges form 110 to 170 usually. He did have a high reading Monday 11/12/2022 of 200 but thinks this was related to [place] meal the day before - potato salad and pasta  Using prodigy voice / talking meter; testing 1 time daily in the morning   Patient denies hypoglycemic s/sx including no dizziness, shakiness, sweating. Patient reports hyperglycemic symptoms including no polyuria, polydipsia, polyphagia, nocturia or blurred vision but he has experience some numbness in both feet recently.  Dr Nani Ravens has recommended at his last visit a trial of venlafaxine but patient declined.  Mr. Buchholtz was prescribed gabapentin 300mg  qhs by [facility], [facility] - updated med list   Hypertension:  Current medications: valsartan and amlodipine   BP Readings from Last 3 Encounters:  10/31/22 138/82  10/26/22 (!) 144/82  10/18/22 136/80    Patient has a validated, automated, upper arm home BP cuff Current blood pressure readings readings: has not checked blood pressure in the last 2 weeks.   Patient denies hypotensive s/sx including no dizziness, lightheadedness.  Patient denies hypertensive symptoms including no headache, chest pain, shortness of breath   Hyperlipidemia/ASCVD Risk Reduction  Current lipid lowering medications: pravastatin   Medications tried in the past: simvastatin and simvastatin +  Ezetimibe (Vytorin)  Antiplatelet regimen: none  ASCVD History: none Risk Factors: DM, hypertension   Medication Management:  Current adherence strategy:  none  Patient reports Good  adherence to medications  Patient reports the following barriers to adherence:  Blindness / vision impairment Complex medication regimen Difficult to administer medication due to vision impairment. Independent pausing, stopping or controlling of the medication. Lack of knowledge and confidence in self-management. Low understanding of medications benefits. Lack of transportation Lack of health insurance / under insured - improved for 2024  Recent fill dates:  Patient has filled all maintenance medications in 2024. He is due to refill all maintenance medicaitons except insulin in the next few days.  Patient reports he would like assistance changing from [place] to [employer] on [address]. Also requesting 100 days supply.   Objective:  Lab Results  Component Value Date   HGBA1C 7.6 (H) 10/31/2022    Lab Results  Component Value Date   CREATININE 1.29 11/07/2022   BUN 18 11/07/2022   NA 132 (L) 11/07/2022   K 4.2 11/07/2022   CL 96 11/07/2022   CO2 28 11/07/2022    Lab Results  Component Value Date   CHOL 128 10/31/2022   HDL 51.10 10/31/2022   LDLCALC 64 10/31/2022   TRIG 64.0 10/31/2022   CHOLHDL 2 10/31/2022    Medications Reviewed Today     Reviewed by Cherre Robins, RPH-CPP (Pharmacist) on 11/14/22 at 97  Med List Status: <None>   Medication Order Taking? Sig Documenting Provider Last Dose Status Informant  amLODipine (NORVASC) 10 MG tablet [id number] Yes Take 1 tablet (10 mg total) by mouth daily. Shelda Pal, DO Taking Active   gabapentin (NEURONTIN) 300 MG capsule [id number] Yes Take 300 mg by mouth at bedtime. [provider] Taking Active   glucose blood (PRODIGY NO CODING BLOOD GLUC) test strip [id number]  USE TO CHECK BLOOD SUGAR 3  TIMES DAILY Nani Ravens Crosby Oyster, DO  Active   hydrochlorothiazide (HYDRODIURIL) 25 MG tablet [id number] Yes Take 1 tablet (25 mg total) by mouth daily. Shelda Pal, DO Taking Active    insulin degludec (TRESIBA FLEXTOUCH) 100 UNIT/ML FlexTouch Pen [id number] No Inject 38 Units into the skin daily.  Patient not taking: Reported on 11/14/2022   Shelda Pal, DO Not Taking Active   Insulin Pen Needle (PEN NEEDLES) 32G X 4 MM MISC [id number] Yes Inject 35 Units as directed daily. To use with levemir Nani Ravens Crosby Oyster, DO Taking Active   metFORMIN (GLUCOPHAGE-XR) 500 MG 24 hr tablet [id number] Yes TAKE 2 TABLETS BY MOUTH TWICE DAILY WITH BREAKFAST AND WITH SUPPER Shelda Pal, DO Taking Active   Multiple Vitamin (MULTIVITAMIN WITH MINERALS) TABS [id number] Yes Take 1 tablet by mouth daily. [provider] Taking Active Self  pioglitazone (ACTOS) 30 MG tablet [id number] Yes Take 1 tablet (30 mg total) by mouth daily. Shelda Pal, DO Taking Active   pravastatin (PRAVACHOL) 40 MG tablet [id number] Yes Take 1 tablet (40 mg total) by mouth daily. Shelda Pal, DO Taking Active   sildenafil (VIAGRA) 100 MG tablet [id number]  Take 1 tablet (100 mg total) by mouth as needed for erectile dysfunction. Shelda Pal, DO  Active   tiZANidine (ZANAFLEX) 2 MG tablet [id number]  Take 0.5-2 tablets (1-4 mg total) by mouth every 8 (eight) hours as needed for muscle spasms. Shelda Pal, DO  Active   valsartan (DIOVAN) 160 MG tablet [id number] Yes Take 1 tablet (160 mg  total) by mouth daily. Shelda Pal, DO Taking Active             Assessment/Plan:   Diabetes: A1c not at goal. Patient asked about once weekly injection because it feels it would be easier for him to administer.  - Reviewed goal A1c, goal fasting, and goal 2 hour post prandial glucose - Recommend to continue current medication for diabetes - Patient is aware to start [place] after he completes current dose of Levemir (has about 3 months left) - Recommend to check glucose daily each morning but to add checking in evening or after a meal 1 or 2 times  per week.   Hypertension:  office blood pressure is usually at goal of < 130/80  - Reviewed long term cardiovascular and renal outcomes of uncontrolled blood pressure - Reviewed appropriate blood pressure monitoring technique and reviewed goal blood pressure. Recommended to check home blood pressure and heart rate 1 or 2 times per week. - Recommend to continue valsartan and amlodipine   Hyperlipidemia/ASCVD Risk Reduction: Last LDL was at goal but patient had low adhernece for statin in 2023. - Reviewed long term complications of uncontrolled cholesterol - Recommend to continue pravastatin daily - will follow adherence in 2024.     Medication Management: - Adherence has improved since patient's new Medicare plan has lower or no copay for 2024.  - Discussed collaboration with local pharmacies for adherence packaging or home delivery. Patient declined packaging - Assisted patient in changing prescriptions over the [facility] and for 100 day supply  Meds ordered this encounter  Medications   amLODipine (NORVASC) 10 MG tablet    Sig: Take 1 tablet (10 mg total) by mouth daily.    Dispense:  100 tablet    Refill:  0   hydrochlorothiazide (HYDRODIURIL) 25 MG tablet    Sig: Take 1 tablet (25 mg total) by mouth daily.    Dispense:  100 tablet    Refill:  0    PROFILE - refill not needed currently   metFORMIN (GLUCOPHAGE-XR) 500 MG 24 hr tablet    Sig: TAKE 2 TABLETS BY MOUTH TWICE DAILY WITH BREAKFAST AND WITH SUPPER    Dispense:  400 tablet    Refill:  0    PROFILE - refill not needed currently   pioglitazone (ACTOS) 30 MG tablet    Sig: Take 1 tablet (30 mg total) by mouth daily.    Dispense:  100 tablet    Refill:  0   pravastatin (PRAVACHOL) 40 MG tablet    Sig: Take 1 tablet (40 mg total) by mouth daily.    Dispense:  100 tablet    Refill:  0   valsartan (DIOVAN) 160 MG tablet    Sig: Take 1 tablet (160 mg total) by mouth daily.    Dispense:  100 tablet    Refill:  0     Follow Up Plan: 2 to 3 months to monitor adherence and BP  Cherre Robins, PharmD Clinical Pharmacist [place]

## 2022-11-15 DIAGNOSIS — M5451 Vertebrogenic low back pain: Secondary | ICD-10-CM | POA: Diagnosis not present

## 2022-11-23 DIAGNOSIS — I10 Essential (primary) hypertension: Secondary | ICD-10-CM | POA: Diagnosis not present

## 2022-11-23 DIAGNOSIS — M5451 Vertebrogenic low back pain: Secondary | ICD-10-CM | POA: Diagnosis not present

## 2022-11-26 ENCOUNTER — Ambulatory Visit: Payer: Medicare Other | Admitting: Gastroenterology

## 2022-11-26 DIAGNOSIS — M5451 Vertebrogenic low back pain: Secondary | ICD-10-CM | POA: Diagnosis not present

## 2022-11-26 NOTE — Progress Notes (Deleted)
[facility] Consult Note:  History: Jose Roberts 11/26/2022  Referring provider: Sharlene Dory, DO  Reason for consult/chief complaint: No chief complaint on file.   Subjective  HPI:  *** From [facility] note dated 10/02/2022 [facility] [place], [place]): "Jose Roberts is a 77 y.o. male with diabetes, blindness, remote history of Fournier's gangrene (2012) who underwent bedside incision and drainage of right sided perirectal abscess on 09/08/2022 by Dr. Sheliah Hatch. He was incidentally found to have COVID but was asymptomatic. He was discharged with Augmentin which he completed. He states there is an area of swelling near the I&D site but it is not painful. He denies much drainage in the past few days. Denies fever.   His wife states this episode began after inserting a suppository. The patient states he also was trying to dilate his rectum with his finger. He reports having a hemorrhoid banding in the past which may have caused a stricture. He also reports needing dilation of the rectum in the past. He states he has had issues with his bowel movements despite taking Metamucil and mineral oil daily. He reports his last colonoscopy was 6 to 7 years ago in [facility]. "  ROS:  Review of Systems   Past Medical History: 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 02/19/2011   Hemorrhoids    Hypertension      Past Surgical History: Past Surgical History:  Procedure Laterality Date   EYE SURGERY     HEMORRHOID SURGERY     revision   RETINAL DETACHMENT SURGERY     surgery on gangrene  May 6/7/8, 2012 (x 3 debridements)   Fournier's Gangrene of perineum     Family History: Family History  Problem Relation Age of Onset   Diabetes Mother    Hypertension Mother    Cancer Mother        pancreas   Diabetes Father    Hypertension Father    Diabetes  Brother    Diabetes Sister     Social History: Social History   Socioeconomic History   Marital status: Married    Spouse name: Not on file   Number of children: Not on file   Years of education: Not on file   Highest education level: Not on file  Occupational History   Not on file  Tobacco Use   Smoking status: Never   Smokeless tobacco: Never  Substance and Sexual Activity   Alcohol use: Yes    Alcohol/week: 12.0 standard drinks of alcohol    Types: 12 Cans of beer per week    Comment: per week   Drug use: No   Sexual activity: Not Currently  Other Topics Concern   Not on file  Social History Narrative   Not on file   Social Determinants of Health   Financial Resource Strain: Low Risk  (11/14/2022)   Overall Financial Resource Strain (CARDIA)    Difficulty of Paying Living Expenses: Not hard at all  Food Insecurity: No Food Insecurity (09/13/2022)   Hunger Vital Sign    Worried About Running Out of Food in the Last Year: Never true    Ran Out of Food in the Last Year: Never true  Transportation Needs: No Transportation Needs (09/13/2022)   PRAPARE - Administrator, Civil Service (Medical): No  Lack of Transportation (Non-Medical): No  Physical Activity: Inactive (08/11/2021)   Exercise Vital Sign    Days of Exercise per Week: 0 days    Minutes of Exercise per Session: 0 min  Stress: No Stress Concern Present (08/11/2021)   [employer] of Occupational Health - Occupational Stress Questionnaire    Feeling of Stress : Not at all  Social Connections: Socially Integrated (08/11/2021)   Social Connection and Isolation Panel [NHANES]    Frequency of Communication with Friends and Family: More than three times a week    Frequency of Social Gatherings with Friends and Family: More than three times a week    Attends Religious Services: More than 4 times per year    Active Member of [employer] or Organizations: Yes    Attends Engineer, structural: More  than 4 times per year    Marital Status: Married    Allergies: No Known Allergies  Outpatient Meds: Current Outpatient Medications  Medication Sig Dispense Refill   amLODipine (NORVASC) 10 MG tablet Take 1 tablet (10 mg total) by mouth daily. 100 tablet 0   gabapentin (NEURONTIN) 300 MG capsule Take 300 mg by mouth at bedtime.     glucose blood (PRODIGY NO CODING BLOOD GLUC) test strip USE TO CHECK BLOOD SUGAR 3  TIMES DAILY 300 strip 3   hydrochlorothiazide (HYDRODIURIL) 25 MG tablet Take 1 tablet (25 mg total) by mouth daily. 100 tablet 0   insulin degludec (TRESIBA FLEXTOUCH) 100 UNIT/ML FlexTouch Pen Inject 38 Units into the skin daily. (Patient not taking: Reported on 11/14/2022) 15 mL 2   Insulin Pen Needle (PEN NEEDLES) 32G X 4 MM MISC Inject 35 Units as directed daily. To use with levemir 90 each 0   metFORMIN (GLUCOPHAGE-XR) 500 MG 24 hr tablet TAKE 2 TABLETS BY MOUTH TWICE DAILY WITH BREAKFAST AND WITH SUPPER 400 tablet 0   Multiple Vitamin (MULTIVITAMIN WITH MINERALS) TABS Take 1 tablet by mouth daily.     pioglitazone (ACTOS) 30 MG tablet Take 1 tablet (30 mg total) by mouth daily. 100 tablet 0   pravastatin (PRAVACHOL) 40 MG tablet Take 1 tablet (40 mg total) by mouth daily. 100 tablet 0   sildenafil (VIAGRA) 100 MG tablet Take 1 tablet (100 mg total) by mouth as needed for erectile dysfunction. 30 tablet 2   tiZANidine (ZANAFLEX) 2 MG tablet Take 0.5-2 tablets (1-4 mg total) by mouth every 8 (eight) hours as needed for muscle spasms. 40 tablet 1   valsartan (DIOVAN) 160 MG tablet Take 1 tablet (160 mg total) by mouth daily. 100 tablet 0   No current facility-administered medications for this visit.      ___________________________________________________________________ Objective   Exam:  There were no vitals taken for this visit. Wt Readings from Last 3 Encounters:  10/31/22 250 lb (113.4 kg)  10/26/22 252 lb (114.3 kg)  10/18/22 265 lb (120.2 kg)    General: ***   Eyes: sclera anicteric, no redness ENT: oral mucosa moist without lesions, no cervical or supraclavicular lymphadenopathy CV: ***, no JVD, no peripheral edema Resp: clear to auscultation bilaterally, normal RR and effort noted GI: soft, *** tenderness, with active bowel sounds. No guarding or palpable organomegaly noted. Skin; warm and dry, no rash or jaundice noted Neuro: awake, alert and oriented x 3. Normal gross motor function and fluent speech  Labs:  Last colonoscopy report (2016) located in care everywhere:  COLONOSCOPY   Date: Jun 13, 2015  Patient name: Jose Roberts DOB:  February 21, 1946 Age: 34 year  MRN: [id number]   OPERATOR: Ellard Artis, MD  INSTRUMENT: Olympus CF-HQ 190L video colonoscope  MONITORS: Automated Blood Pressure Cuff, Cardiac Monitor, Pulse Oximeter  MEDICATIONS: Propofol Anesthesia. Supplemental oxygen. Please refer to the CRNA anesthesia record for dosage of Propofol and other pertinent IV medications administered, if any.   ASA CLASS: 2   INDICATION: Colon Cancer Screening  The patient's history and physical examination were reviewed and updated. A presurgical assessment was performed, and the patient cleared for deep sedation/analgesia.  PROCEDURE: A time out was taken, and the patient's identity was verified and the proposed procedure confirmed. The patient was placed in the left lateral decubitus position and a rectal examination was performed that was normal. The colonoscope was introduced into the rectum and guided to the cecum with no difficulty. The cecum was identified by the anatomic landmarks of the appendiceal orifice and ileocecal valve . The preparation was good. All segments of the colon were thoroughly examined. A retroflex view of the rectum was performed. After the examination was completed, the colon was decompressed and the patient was transported to recovery in stable condition. There were no apparent complications.   FINDINGS:   Moderate Diverticulosis of the Left colon was noted.  Medium internal hemorrhoids were noted on retroflex view.   IMPRESSION: Findings as noted above.  PLAN: Routine post colonoscopy orders. Repeat screening colonoscopy in 10 years.  Thank you for allowing [place] to participate in [facility]'s care.  plan reviewed. ap,rn  Electronically signed EZ:MOQH Badreddine M.D. Jun 13 2015 1:06PM EST   Radiologic Studies:  ***  Assessment: No diagnosis found.  ***  Plan:  ***  Thank you for the courtesy of this consult.  Please call me with any questions or concerns.  Charlie Pitter III  CC: Referring provider noted above

## 2022-11-28 ENCOUNTER — Telehealth: Payer: Self-pay | Admitting: Family Medicine

## 2022-11-28 NOTE — Telephone Encounter (Signed)
Patient informed of PCP instructions and stated that he would keep the appt

## 2022-11-28 NOTE — Telephone Encounter (Signed)
Pt wants to know if he needs to really come in on Monday. Please call [phone] to advise.

## 2022-11-28 NOTE — Telephone Encounter (Signed)
I recommend it as we are following up on a new medication started last mo. Ty.

## 2022-11-30 DIAGNOSIS — M5451 Vertebrogenic low back pain: Secondary | ICD-10-CM | POA: Diagnosis not present

## 2022-12-03 ENCOUNTER — Encounter: Payer: Self-pay | Admitting: Family Medicine

## 2022-12-03 ENCOUNTER — Ambulatory Visit (INDEPENDENT_AMBULATORY_CARE_PROVIDER_SITE_OTHER): Payer: Medicare Other | Admitting: Family Medicine

## 2022-12-03 VITALS — BP 132/70 | HR 100 | Temp 98.0°F | Ht 71.0 in | Wt 244.2 lb

## 2022-12-03 DIAGNOSIS — E1140 Type 2 diabetes mellitus with diabetic neuropathy, unspecified: Secondary | ICD-10-CM

## 2022-12-03 DIAGNOSIS — M5451 Vertebrogenic low back pain: Secondary | ICD-10-CM | POA: Diagnosis not present

## 2022-12-03 DIAGNOSIS — Z794 Long term (current) use of insulin: Secondary | ICD-10-CM | POA: Diagnosis not present

## 2022-12-03 MED ORDER — GABAPENTIN 300 MG PO CAPS: 300.0000 mg | ORAL_CAPSULE | Freq: Two times a day (BID) | ORAL | 2 refills | Status: DC

## 2022-12-03 NOTE — Patient Instructions (Signed)
You can get your tetanus booster at any pharmacy, same with the shingles vaccine.   Strong work with your weight loss.   Send me a message in a month with how you are doing on the new nerve medication.   Let us know if you need anything.

## 2022-12-03 NOTE — Progress Notes (Signed)
Chief Complaint  Patient presents with   Follow-up    Subjective: Patient is a 77 y.o. male here for f/u.  Started on Effexor XR 37.5 mg/d for diabetic neuropathy in his feet. He never took it. He has been taking gabapentin 300 mg bid. Reports compliance and no AE's, including drowsiness. S/s's are slightly better than last time, worse on L side. He is working with PT for low back issues that have been improving.   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 02/19/2011   Hemorrhoids    Hypertension     Objective: BP 132/70 (BP Location: Left Arm, Patient Position: Sitting, Cuff Size: Large)   Pulse 100   Temp 98 F (36.7 C) (Oral)   Ht  (1.803 m)   Wt 244 lb 4 oz (110.8 kg)   SpO2 99%   BMI 34.07 kg/m  General: Awake, appears stated age Lungs: No accessory muscle use Psych: Age appropriate judgment and insight, normal affect and mood  Assessment and Plan: Type 2 diabetes mellitus with diabetic neuropathy, with long-term current use of insulin  Chronic, not fully controlled. Start Effexor XR 37.5 mg/d. Cont gabapentin 300 mg bid. Send message on MyChart in 1 mo to update with how he is doing. Has intentionally lost weight.  F/u as originally scheduled in 5 mo or prn pending above. The patient voiced understanding and agreement to the plan.  Jilda Roche [place], DO 12/03/22  9:45 AM

## 2022-12-05 ENCOUNTER — Other Ambulatory Visit: Payer: Self-pay | Admitting: Family Medicine

## 2022-12-06 DIAGNOSIS — M5451 Vertebrogenic low back pain: Secondary | ICD-10-CM | POA: Diagnosis not present

## 2022-12-06 DIAGNOSIS — R29898 Other symptoms and signs involving the musculoskeletal system: Secondary | ICD-10-CM | POA: Diagnosis not present

## 2022-12-10 DIAGNOSIS — M5451 Vertebrogenic low back pain: Secondary | ICD-10-CM | POA: Diagnosis not present

## 2022-12-10 DIAGNOSIS — R29898 Other symptoms and signs involving the musculoskeletal system: Secondary | ICD-10-CM | POA: Diagnosis not present

## 2022-12-12 ENCOUNTER — Telehealth: Payer: Self-pay | Admitting: Family Medicine

## 2022-12-12 DIAGNOSIS — E1169 Type 2 diabetes mellitus with other specified complication: Secondary | ICD-10-CM

## 2022-12-12 MED ORDER — TRESIBA FLEXTOUCH 100 UNIT/ML ~~LOC~~ SOPN: 38.0000 [IU] | PEN_INJECTOR | Freq: Every day | SUBCUTANEOUS | 2 refills | Status: DC

## 2022-12-12 NOTE — Telephone Encounter (Signed)
Jose Roberts, pharmacist with [facility] called to advise that the Jose Roberts is discontinued and pt's insurance does not cover the generic for Jose Roberts because it's a non formulary. They will cover the brand name Jose Roberts. Pt's insurance requires pt to use [facility] for prescriptions so if provider is okay with this change, please send the refill there.

## 2022-12-12 NOTE — Telephone Encounter (Signed)
Sent in

## 2022-12-12 NOTE — Addendum Note (Signed)
Addended by: Scharlene Gloss B on: 12/12/2022 04:59 PM   Modules accepted: Orders

## 2022-12-13 DIAGNOSIS — R29898 Other symptoms and signs involving the musculoskeletal system: Secondary | ICD-10-CM | POA: Diagnosis not present

## 2022-12-13 DIAGNOSIS — M5451 Vertebrogenic low back pain: Secondary | ICD-10-CM | POA: Diagnosis not present

## 2022-12-17 DIAGNOSIS — M5451 Vertebrogenic low back pain: Secondary | ICD-10-CM | POA: Diagnosis not present

## 2022-12-17 DIAGNOSIS — R29898 Other symptoms and signs involving the musculoskeletal system: Secondary | ICD-10-CM | POA: Diagnosis not present

## 2022-12-25 ENCOUNTER — Telehealth: Payer: Self-pay | Admitting: Family Medicine

## 2022-12-25 NOTE — Telephone Encounter (Signed)
Pt needs a handicap placard form completed. Pt will drop off form. Pt also said he is trying to get a refund on airline tickets and he cannot fly because of his back and they will not refund his ticket without proof he has issues with his back. Please call pt to advise if DR Carmelia Roller can help with this.

## 2022-12-25 NOTE — Telephone Encounter (Signed)
Called and scheduled appointment for this Friday 12/28/2022 at 9:45 AM

## 2022-12-25 NOTE — Telephone Encounter (Signed)
Appointment for both

## 2022-12-28 ENCOUNTER — Ambulatory Visit (INDEPENDENT_AMBULATORY_CARE_PROVIDER_SITE_OTHER): Payer: Medicare Other | Admitting: Family Medicine

## 2022-12-28 ENCOUNTER — Encounter: Payer: Self-pay | Admitting: Family Medicine

## 2022-12-28 ENCOUNTER — Telehealth: Payer: Self-pay | Admitting: Family Medicine

## 2022-12-28 VITALS — BP 118/78 | HR 89 | Temp 98.0°F | Ht 71.0 in | Wt 246.1 lb

## 2022-12-28 DIAGNOSIS — M545 Low back pain, unspecified: Secondary | ICD-10-CM | POA: Diagnosis not present

## 2022-12-28 DIAGNOSIS — M79605 Pain in left leg: Secondary | ICD-10-CM

## 2022-12-28 DIAGNOSIS — R29898 Other symptoms and signs involving the musculoskeletal system: Secondary | ICD-10-CM | POA: Diagnosis not present

## 2022-12-28 DIAGNOSIS — M5451 Vertebrogenic low back pain: Secondary | ICD-10-CM | POA: Diagnosis not present

## 2022-12-28 NOTE — Patient Instructions (Signed)
Heat (pad or rice pillow in microwave) over affected area, 10-15 minutes twice daily.   Ice/cold pack over area for 10-15 min twice daily.  OK to take Tylenol 1000 mg (2 extra strength tabs) or 975 mg (3 regular strength tabs) every 6 hours as needed.  Let us know if you need anything.  

## 2022-12-28 NOTE — Telephone Encounter (Signed)
Patient called to say that his wife filled out the form incorrectly to get the handicap placard. Pt would like a call back.

## 2022-12-28 NOTE — Progress Notes (Signed)
Chief Complaint  Patient presents with   handi cap placard    Letter to excuse from flying     Subjective: Patient is a 77 y.o. male here for f/u.  Patient has a 82-month history of low back pain.  He is following with physical therapy and a specialist through [facility].  It causes him pain to sit for periods of time.  He had to cancel a flight because of this.  He needs a letter of medical necessity for this.  No bowel/bladder changes.  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 02/19/2011   Hemorrhoids    Hypertension     Objective: BP 118/78 (BP Location: Right Arm, Patient Position: Sitting, Cuff Size: Large)   Pulse 89   Temp 98 F (36.7 C) (Oral)   Ht 5\' 11"  (1.803 m)   Wt 246 lb 2 oz (111.6 kg)   SpO2 99%   BMI 34.33 kg/m  General: Awake, appears stated age MSK: Mild TTP over the left SI joint and left lower lumbar paraspinal musculature.  No deformity or excessive warmth. Neuro: Gait is slow and cautious (patient is blind) Lungs: No accessory muscle use Psych: Age appropriate judgment and insight, normal affect and mood  Assessment and Plan: Low back pain radiating to left leg  Letter given stating he should avoid sitting for long peers of time that he should not currently fly.  Continue working with physical therapy.  Heat, ice, Tylenol.  Follow-up as originally scheduled. The patient voiced understanding and agreement to the plan.  Jilda Roche [place], DO 12/28/22  10:30 AM

## 2022-12-28 NOTE — Telephone Encounter (Signed)
Called to inform just provide new correctly completed form and PCP will be happy to sign it. Patient verbalized understanding and will get to [place] shortly

## 2023-01-09 ENCOUNTER — Encounter (HOSPITAL_BASED_OUTPATIENT_CLINIC_OR_DEPARTMENT_OTHER): Payer: Self-pay

## 2023-01-10 ENCOUNTER — Other Ambulatory Visit (HOSPITAL_BASED_OUTPATIENT_CLINIC_OR_DEPARTMENT_OTHER): Payer: Self-pay | Admitting: Family

## 2023-01-10 ENCOUNTER — Encounter (HOSPITAL_BASED_OUTPATIENT_CLINIC_OR_DEPARTMENT_OTHER): Payer: Self-pay | Admitting: Family

## 2023-01-10 ENCOUNTER — Other Ambulatory Visit (HOSPITAL_BASED_OUTPATIENT_CLINIC_OR_DEPARTMENT_OTHER): Payer: Self-pay

## 2023-01-10 ENCOUNTER — Ambulatory Visit (HOSPITAL_BASED_OUTPATIENT_CLINIC_OR_DEPARTMENT_OTHER): Payer: Medicare Other | Admitting: Family

## 2023-01-10 VITALS — BP 125/76 | HR 95 | Ht 71.0 in | Wt 243.0 lb

## 2023-01-10 DIAGNOSIS — I10 Essential (primary) hypertension: Secondary | ICD-10-CM

## 2023-01-10 DIAGNOSIS — E785 Hyperlipidemia, unspecified: Secondary | ICD-10-CM

## 2023-01-10 DIAGNOSIS — I700 Atherosclerosis of aorta: Secondary | ICD-10-CM

## 2023-01-10 MED ORDER — AMLODIPINE-VALSARTAN-HCTZ 10-160-25 MG PO TABS: 1.0000 | ORAL_TABLET | Freq: Every day | ORAL | 1 refills | Status: DC

## 2023-01-10 NOTE — Telephone Encounter (Signed)
Rx request sent to pharmacy.  

## 2023-01-10 NOTE — Progress Notes (Signed)
Advanced Hypertension Clinic Initial Assessment:    Date:  01/13/2023   ID:  Jose, Roberts [birth year], MRN [id number]  PCP:  Sharlene Dory, DO  Cardiologist:  None  Nephrologist:  Referring MD: Venita Lick, MD   CC: Hypertension  History of Present Illness:    Jose Roberts is a 77 y.o. male with a hx of hypertension, HLD, aortic atherosclerosis, DM2 with neuropathy, blindness, Fournier's gangrene requiring I&D 2012, low back pain here to establish care in the Advanced Hypertension Clinic after referral by [facility].  DALONTAE Roberts was diagnosed with hypertension in his 77s.  Blood pressure checked with arm cuff at home. Readings have been gradually increasing. Readings at home have been 120-130s. he reports tobacco use never. Alcohol use 6-8 drinks per week. For exercise he recently completed physical therapy and enjoys walking. Works out with Systems analyst at [employer] in [place]. he eats at home and outside of the home and does follow low sodium diet. No snoring and no daytime somnolence.   Previous antihypertensives: Lisinopril-HCTZ  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 02/19/2011   Hemorrhoids    Hypertension     Past Surgical History:  Procedure Laterality Date   EYE SURGERY     HEMORRHOID SURGERY     revision   RETINAL DETACHMENT SURGERY     surgery on gangrene  May 6/7/8, 2012 (x 3 debridements)   Fournier's Gangrene of perineum    Current Medications: Current Meds  Medication Sig   gabapentin (NEURONTIN) 300 MG capsule Take 1 capsule (300 mg total) by mouth 2 (two) times daily. (Patient taking differently: Take 300 mg by mouth daily.)   glucose blood (PRODIGY NO CODING BLOOD GLUC) test strip USE TO CHECK BLOOD SUGAR 3  TIMES DAILY   ibuprofen (ADVIL) 200 MG tablet Take 400 mg by mouth every 6 (six) hours as needed.   Insulin Pen Needle (PEN NEEDLES)  32G X 4 MM MISC Inject 35 Units as directed daily. To use with levemir   metFORMIN (GLUCOPHAGE-XR) 500 MG 24 hr tablet TAKE 2 TABLETS BY MOUTH TWICE DAILY WITH BREAKFAST AND WITH SUPPER   Multiple Vitamin (MULTIVITAMIN WITH MINERALS) TABS Take 1 tablet by mouth daily.   pioglitazone (ACTOS) 30 MG tablet Take 1 tablet (30 mg total) by mouth daily.   pravastatin (PRAVACHOL) 40 MG tablet Take 1 tablet (40 mg total) by mouth daily.   sildenafil (VIAGRA) 100 MG tablet TAKE 1 TABLET (100 MG TOTAL) BY MOUTH AS NEEDED FOR ERECTILE DYSFUNCTION.   TRESIBA FLEXTOUCH 100 UNIT/ML FlexTouch Pen Inject 38 Units into the skin daily.   venlafaxine XR (EFFEXOR-XR) 37.5 MG 24 hr capsule Take 37.5 mg by mouth daily with breakfast.   [DISCONTINUED] amLODipine (NORVASC) 10 MG tablet Take 1 tablet (10 mg total) by mouth daily.   [DISCONTINUED] amLODIPine-Valsartan-HCTZ 10-160-25 MG TABS Take 1 tablet by mouth daily.   [DISCONTINUED] hydrochlorothiazide (HYDRODIURIL) 25 MG tablet Take 1 tablet (25 mg total) by mouth daily.   [DISCONTINUED] valsartan (DIOVAN) 160 MG tablet Take 1 tablet (160 mg total) by mouth daily.     Allergies:   Patient has no known allergies.   Social History   Socioeconomic History   Marital status: Married    Spouse name: Not on file   Number of children: Not on file   Years  of education: Not on file   Highest education level: Not on file  Occupational History   Not on file  Tobacco Use   Smoking status: Never   Smokeless tobacco: Never  Substance and Sexual Activity   Alcohol use: Yes    Alcohol/week: 12.0 standard drinks of alcohol    Types: 12 Cans of beer per week    Comment: per week   Drug use: No   Sexual activity: Not Currently  Other Topics Concern   Not on file  Social History Narrative   Not on file   Social Determinants of Health   Financial Resource Strain: Low Risk  (11/14/2022)   Overall Financial Resource Strain (CARDIA)    Difficulty of Paying Living  Expenses: Not hard at all  Food Insecurity: No Food Insecurity (09/13/2022)   Hunger Vital Sign    Worried About Running Out of Food in the Last Year: Never true    Ran Out of Food in the Last Year: Never true  Transportation Needs: No Transportation Needs (09/13/2022)   PRAPARE - Administrator, Civil Service (Medical): No    Lack of Transportation (Non-Medical): No  Physical Activity: Sufficiently Active (01/10/2023)   Exercise Vital Sign    Days of Exercise per Week: 6 days    Minutes of Exercise per Session: 30 min  Stress: No Stress Concern Present (08/11/2021)   [employer] of Occupational Health - Occupational Stress Questionnaire    Feeling of Stress : Not at all  Social Connections: Socially Integrated (08/11/2021)   Social Connection and Isolation Panel [NHANES]    Frequency of Communication with Friends and Family: More than three times a week    Frequency of Social Gatherings with Friends and Family: More than three times a week    Attends Religious Services: More than 4 times per year    Active Member of [employer] or Organizations: Yes    Attends Engineer, structural: More than 4 times per year    Marital Status: Married     Family History: The patient's family history includes Cancer in his mother; Diabetes in his brother, father, mother, and sister; Heart failure in his father; Hypertension in his father and mother.  ROS:   Please see the history of present illness.     All other systems reviewed and are negative.  EKGs/Labs/Other Studies Reviewed:    EKG:  EKG is  ordered today.  The ekg ordered today demonstrates NSR 95 bpm with  nonspecific TWI lead III, V1. Overall low voltage.  Recent Labs: 09/08/2022: Hemoglobin 14.4; Platelets 214 10/31/2022: ALT 10 01/10/2023: BUN 13; Creatinine, Ser 1.15; Potassium 4.7; Sodium 141; TSH 1.810   Recent Lipid Panel    Component Value Date/Time   CHOL 128 10/31/2022 0939   TRIG 64.0 10/31/2022 0939    HDL 51.10 10/31/2022 0939   CHOLHDL 2 10/31/2022 0939   VLDL 12.8 10/31/2022 0939   LDLCALC 64 10/31/2022 0939    Physical Exam:   VS:  BP 125/76 Comment: right arm  Pulse 95   Ht 5\' 11"  (1.803 m)   Wt 243 lb (110.2 kg)   BMI 33.89 kg/m  , BMI Body mass index is 33.89 kg/m. GENERAL:  Well appearing HEENT: Pupils equal round and reactive, fundi not visualized, oral mucosa unremarkable NECK:  No jugular venous distention, waveform within normal limits, carotid upstroke brisk and symmetric, no bruits, no thyromegaly LYMPHATICS:  No cervical adenopathy LUNGS:  Clear to auscultation bilaterally  HEART:  RRR.  PMI not displaced or sustained,S1 and S2 within normal limits, no S3, no S4, no clicks, no rubs, no murmurs ABD:  Flat, positive bowel sounds normal in frequency in pitch, no bruits, no rebound, no guarding, no midline pulsatile mass, no hepatomegaly, no splenomegaly EXT:  2 plus pulses throughout, no edema, no cyanosis no clubbing SKIN:  No rashes no nodules NEURO:  Cranial nerves II through XII grossly intact, motor grossly intact throughout PSYCH:  Cognitively intact, oriented to person place and time   ASSESSMENT/PLAN:    HTN - BP at goal though requiring multiple agents. Diagnosed with hypertension in his 82s. For simplification of medication regimen, stop Amlodipine, Valsartan, HCTZ. Start Amlodipine-Valsartan-HTZ 10-160-25mg  QD.  Secondary workup: Plan for carotid duplex (BP >10 points different between two arms), renal duplex, TSH. No snoring suggestive of OSA. CT abdomen/pelvis 09/08/22 with stable benign right renal adenoma but no pheochromocytoma. Non-cushingoid appearance.   DM2 - Continue to follow with PCP. If additional agent needed consider SGLT2i for cardioprotective benefit.   Aortic atherosclerosis / HLD - Stable with no anginal symptoms. No indication for ischemic evaluation.  Continue Pravastatin.   Screening for Secondary Hypertension:     01/13/2023    6:40  PM  Causes  Drugs/Herbals Screened  Renovascular HTN Screened     - Comments 01/10/23 renal duplex ordered  Sleep Apnea Screened     - Comments no snoring nor daytime somnolence.  Thyroid Disease Screened  Hyperaldosteronism Not Screened     - Comments hesitant to hold ARB to screen  Pheochromocytoma Screened     - Comments ct 08/2022 with benign adrenal adenoma and no pheochromocytoma  Cushing's Syndrome N/A     - Comments non cushingoid appearance  Coarctation of the Aorta Screened     - Comments 01/10/23 carotid duplex ordered  Compliance Screened     - Comments takes medication on routine basis    Relevant Labs/Studies:    Latest Ref Rng & Units 01/10/2023   11:22 AM 11/07/2022    3:15 PM 10/31/2022    9:39 AM  Basic Labs  Sodium 134 - 144 mmol/L 141  132  136   Potassium 3.5 - 5.2 mmol/L 4.7  4.2  3.4   Creatinine 0.76 - 1.27 mg/dL 1.61  0.96  0.45        Latest Ref Rng & Units 01/10/2023   11:22 AM  Thyroid   TSH 0.450 - 4.500 uIU/mL 1.810                 01/10/2023   10:53 AM  Renovascular   Renal Artery [place] Completed Yes      Disposition:    FU with MD/PharmD in 2 months    Medication Adjustments/Labs and Tests Ordered: Current medicines are reviewed at length with the patient today.  Concerns regarding medicines are outlined above.  Orders Placed This Encounter  Procedures   TSH   Basic metabolic panel   EKG 12-Lead   VAS US CAROTID   VAS US RENAL ARTERY DUPLEX   Meds ordered this encounter  Medications   DISCONTD: amLODIPine-Valsartan-HCTZ 10-160-25 MG TABS    Sig: Take 1 tablet by mouth daily.    Dispense:  30 tablet    Refill:  1     Signed, Alver Sorrow, NP  01/13/2023 6:41 PM    [facility]

## 2023-01-10 NOTE — Patient Instructions (Addendum)
Medication Instructions:  Your physician has recommended you make the following change in your medication:   STOP: Amlodipine, valsartan, HCTZ   Start: Amlodipine- Valsartan- HCTZ 10-160-25mg  daily    Labwork: Your physician recommends that you return for lab work today- TSH & BMP    Testing/Procedures: Your physician has requested that you have a renal artery duplex. During this test, an ultrasound is used to evaluate blood flow to the kidneys. Allow one hour for this exam. Do not eat after midnight the day before and avoid carbonated beverages. Take your medications as you usually do.  Your physician has requested that you have a carotid duplex. This test is an ultrasound of the carotid arteries in your neck. It looks at blood flow through these arteries that supply the brain with blood. Allow one hour for this exam. There are no restrictions or special instructions.    Follow-Up: 2 month in [facility] HTN CLINIC

## 2023-01-11 LAB — BASIC METABOLIC PANEL
BUN/Creatinine Ratio: 11 (ref 10–24)
BUN: 13 mg/dL (ref 8–27)
CO2: 23 mmol/L (ref 20–29)
Calcium: 10.4 mg/dL — ABNORMAL HIGH (ref 8.6–10.2)
Chloride: 101 mmol/L (ref 96–106)
Creatinine, Ser: 1.15 mg/dL (ref 0.76–1.27)
Glucose: 97 mg/dL (ref 70–99)
Potassium: 4.7 mmol/L (ref 3.5–5.2)
Sodium: 141 mmol/L (ref 134–144)
eGFR: 66 mL/min/{1.73_m2} (ref >59)

## 2023-01-11 LAB — TSH: TSH: 1.81 u[IU]/mL (ref 0.450–4.500)

## 2023-01-13 ENCOUNTER — Encounter (HOSPITAL_BASED_OUTPATIENT_CLINIC_OR_DEPARTMENT_OTHER): Payer: Self-pay | Admitting: Family

## 2023-01-16 ENCOUNTER — Ambulatory Visit (INDEPENDENT_AMBULATORY_CARE_PROVIDER_SITE_OTHER): Payer: Self-pay | Admitting: Pharmacist

## 2023-01-16 VITALS — BP 128/84

## 2023-01-16 DIAGNOSIS — E669 Obesity, unspecified: Secondary | ICD-10-CM

## 2023-01-16 DIAGNOSIS — E785 Hyperlipidemia, unspecified: Secondary | ICD-10-CM

## 2023-01-16 DIAGNOSIS — E1169 Type 2 diabetes mellitus with other specified complication: Secondary | ICD-10-CM

## 2023-01-16 DIAGNOSIS — I10 Essential (primary) hypertension: Secondary | ICD-10-CM

## 2023-01-16 NOTE — Progress Notes (Signed)
01/16/2023 Name: Jose Roberts MRN: [id number] DOB: 18-Sep-1945  Chief Complaint  Patient presents with   Diabetes   Medication Management   Hypertension    TAHJIR DENEKE is a 77 y.o. year old male who presented for a telephone visit.   He was identified by the Clinical Pharmacist Practitioner by a quality report for assistance in managing diabetes, hypertension, hyperlipidemia, and complex medication management.   Care Team: Primary Care Provider: Sharlene Dory, DO ; Next Scheduled Visit: 12/03/2022 Dr Myrtie Neither - GI 11/2022 Cardiology -   Medication Access/Adherence  Current Pharmacy:  [facility]) - [place], [place] - [address] [facility] [address] [place] [address] [place] [address] Phone: [phone] Fax: [phone]  [facility] - [place], [place] - [address] [place] [address] Phone: [phone] Fax: [phone]  [facility] - [place], [place] - [address] [place] [address] Phone: [phone] Fax: [phone]  [email or web address] [place], [place] - [address] [place] [address] Phone: [phone] Fax: [phone]  [facility] [address] [place], [place] - [address] [place] [address] Phone: [phone] Fax: [phone]   Patient reported affordability concerns with their medications:  previously in 2023 but this year most of his medications are $0 copay ; Patient failed MAC measure in 2023.  Patient reports access/transportation concerns to their pharmacy: Yes  patient is legally blind since 77 yo Patient reports adherence concerns with their medications:  No     Diabetes:  Current medications:  Metformin ER 500mg , pioglitazone 30mg  and Levemir insulin 30 units (med list has 38 units by patient is afraid of hypoglycemia so has only been taking 30  units once a day). He will switch to [place] 30 units daily once current supply of Levemir is used. He estimates he has about 6 weeks of Levemir left.   Current glucose readings: 104 today. Ranges form 88 to 135 usually.   Using prodigy voice / talking meter; testing 1 time daily in the morning. Had suggested taking blood glucose at varying times of day but patient reports he forgets to take later in the day.   Patient denies hypoglycemic s/sx including no dizziness, shakiness, sweating. Patient reports hyperglycemic symptoms - no polyuria, polydipsia, polyphagia, nocturia or blurred vision but he has experience some neuropathy.  Dr Carmelia Roller prescribed venlafaxine 35.7mg  daily - patient took for 1 month but did not feel that it helped with neuropathy. He is taking gabapentin 300mg  1 or 2 times per day  Hypertension:  Current medications: valsartan-amlodipine-hydrochlorothiazide 160-10-25mg  combo prescribed yesterday by cardiology. Patient has not picked up or started yet. He is still taking separate agents but plans to start combo soon.   BP Readings from Last 3 Encounters:  01/16/23 128/84  01/10/23 125/76  12/28/22 118/78    Patient has a validated, automated, upper arm home BP cuff Current blood pressure readings readings: 128/84 today Report blood pressure usually 120 to 140 / 80's   Patient denies hypotensive s/sx including no dizziness, lightheadedness.  Patient denies hypertensive symptoms including no headache, chest pain, shortness of breath   Hyperlipidemia/ASCVD Risk Reduction  Current lipid lowering medications: pravastatin   Medications tried in the past: simvastatin and simvastatin + Ezetimibe (Vytorin)  Antiplatelet regimen: none  ASCVD History: none Risk Factors: DM, hypertension   Medication Management:  Current adherence strategy:  none  Patient reports Good adherence to medications  Patient reports the following barriers to adherence:  Blindness /  vision impairment Complex medication regimen Difficult to administer medication due to vision impairment. Independent pausing, stopping or controlling of the medication. Lack of knowledge and confidence in self-management. Low understanding of medications benefits. Lack of transportation Lack of health insurance / under insured - improved for 2024   Objective:  Lab Results  Component Value Date   HGBA1C 7.6 (H) 10/31/2022    Lab Results  Component Value Date   CREATININE 1.15 01/10/2023   BUN 13 01/10/2023   NA 141 01/10/2023   K 4.7 01/10/2023   CL 101 01/10/2023   CO2 23 01/10/2023    Lab Results  Component Value Date   CHOL 128 10/31/2022   HDL 51.10 10/31/2022   LDLCALC 64 10/31/2022   TRIG 64.0 10/31/2022   CHOLHDL 2 10/31/2022    Medications Reviewed Today     Reviewed by Henrene Pastor, RPH-CPP (Pharmacist) on 01/16/23 at 1355  Med List Status: <None>   Medication Order Taking? Sig Documenting Provider Last Dose Status Informant  amLODIPine-Valsartan-HCTZ 10-160-25 MG TABS [id number] No TAKE 1 TABLET BY MOUTH DAILY  Patient not taking: Reported on 01/16/2023   Alver Sorrow, NP Not Taking Active            Med Note Clydie Braun, [place] B   Wed Jan 16, 2023  1:51 PM) 01/16/23-Still taking separate pills until completes current prescriptions  gabapentin (NEURONTIN) 300 MG capsule [id number] Yes Take 1 capsule (300 mg total) by mouth 2 (two) times daily.  Patient taking differently: Take 300 mg by mouth daily.   Sharlene Dory, DO Taking Active   glucose blood (PRODIGY NO CODING BLOOD GLUC) test strip [id number] Yes USE TO CHECK BLOOD SUGAR 3  TIMES DAILY Carmelia Roller Jilda Roche, DO Taking Active   ibuprofen (ADVIL) 200 MG tablet [id number]  Take 400 mg by mouth every 6 (six) hours as needed. [provider]  Active   Insulin Pen Needle (PEN NEEDLES) 32G X 4 MM MISC [id number] Yes Inject 35 Units as directed daily. To use with levemir Carmelia Roller Jilda Roche, DO Taking Active   metFORMIN (GLUCOPHAGE-XR) 500 MG 24 hr tablet [id number] Yes TAKE 2 TABLETS BY MOUTH TWICE DAILY WITH BREAKFAST AND WITH SUPPER Sharlene Dory, DO Taking Active   Multiple Vitamin (MULTIVITAMIN WITH MINERALS) TABS [id number] Yes Take 1 tablet by mouth daily. [provider] Taking Active Self  pioglitazone (ACTOS) 30 MG tablet [id number] Yes Take 1 tablet (30 mg total) by mouth daily. Sharlene Dory, DO Taking Active   pravastatin (PRAVACHOL) 40 MG tablet [id number] Yes Take 1 tablet (40 mg total) by mouth daily. Sharlene Dory, DO Taking Active   sildenafil (VIAGRA) 100 MG tablet [id number] Yes TAKE 1 TABLET (100 MG TOTAL) BY MOUTH AS NEEDED FOR ERECTILE DYSFUNCTION. Sharlene Dory, DO Taking Active   TRESIBA FLEXTOUCH 100 UNIT/ML FlexTouch Pen [id number] No Inject 38 Units into the skin daily.  Patient not taking: Reported on 01/16/2023   Sharlene Dory, DO Not Taking Active            Med Note Clydie Braun, [place] B   Wed Jan 16, 2023  1:50 PM) 01/16/2023- taking Levemir 30 units until he finishes current dose.   venlafaxine XR (EFFEXOR-XR) 37.5 MG 24 hr capsule [id number] No Take 37.5 mg by mouth daily with breakfast.  Patient not taking: Reported  on 01/16/2023   [provider] Not Taking Active             Assessment/Plan:   Diabetes: A1c not at goal. Patient asked about once weekly injection because he feels it would be easier for him to administer.  - Reviewed goal A1c, goal fasting, and goal 2 hour post prandial glucose - Recommend to continue current medication for diabetes - Patient is aware to start [place] after he completes current dose of Levemir (has about 6 weeks left) - Discussed changing to GLP1 or Mounjaro. Patient is concerned about cost and Medicare coverage gap. He reported household income is close to [employer] medication assistance program cut off. He declined to apply today but will think about  starting Ozempic. - Recommend to check glucose daily each morning but to add checking in evening or after a meal 1 or 2 times per week.   Hypertension:  office blood pressure is usually at goal of < 130/80  - Reviewed long term cardiovascular and renal outcomes of uncontrolled blood pressure - Reviewed appropriate blood pressure monitoring technique and reviewed goal blood pressure. Recommended to check home blood pressure and heart rate 1 or 2 times per week. - Recommend to start combo blood pressure medication - valsartan-amlodipine-hydrochlorothiazide once daily   Hyperlipidemia/ASCVD Risk Reduction: Last LDL was at goal; Noted that patient had low adhernece for statin in 2023. - Reviewed long term complications of uncontrolled cholesterol - Recommend to continue pravastatin daily - will follow adherence in 2024.     Medication Management: - Adherence has improved since patient's new Medicare plan has lower or no copay for 2024.   Follow Up Plan: 2 to 3 months to monitor adherence and BP  Henrene Pastor, PharmD Clinical Pharmacist [facility] [address] [facility]

## 2023-01-22 ENCOUNTER — Other Ambulatory Visit (HOSPITAL_BASED_OUTPATIENT_CLINIC_OR_DEPARTMENT_OTHER): Payer: Self-pay | Admitting: Family

## 2023-01-22 DIAGNOSIS — I700 Atherosclerosis of aorta: Secondary | ICD-10-CM

## 2023-01-22 DIAGNOSIS — E785 Hyperlipidemia, unspecified: Secondary | ICD-10-CM

## 2023-01-22 DIAGNOSIS — I10 Essential (primary) hypertension: Secondary | ICD-10-CM

## 2023-01-31 ENCOUNTER — Encounter: Payer: Self-pay | Admitting: Family Medicine

## 2023-01-31 ENCOUNTER — Ambulatory Visit (INDEPENDENT_AMBULATORY_CARE_PROVIDER_SITE_OTHER): Payer: Medicare Other | Admitting: Family Medicine

## 2023-01-31 VITALS — BP 139/80 | HR 100 | Temp 98.2°F | Ht 71.0 in | Wt 243.0 lb

## 2023-01-31 DIAGNOSIS — K611 Rectal abscess: Secondary | ICD-10-CM

## 2023-01-31 MED ORDER — AMOXICILLIN-POT CLAVULANATE 875-125 MG PO TABS: 1.0000 | ORAL_TABLET | Freq: Two times a day (BID) | ORAL | 0 refills | Status: AC

## 2023-01-31 NOTE — Progress Notes (Signed)
   Acute Office Visit  Subjective:     Patient ID: Jose Roberts, male    DOB: August 08, 1946, 77 y.o.   MRN: [id number]  Chief Complaint  Patient presents with   Abscess    HPI Patient is in today for rectal abscess.   Discussed the use of AI scribe software for clinical note transcription with the patient, who gave verbal consent to proceed.  History of Present Illness   The patient presents with a painful mass on the right side of his anal area, first noticed after a bowel movement on Tuesday. The mass has progressively enlarged and is particularly painful when sitting. He denies any drainage or bleeding from the mass and has not experienced fever, chills, or body aches.  This is the third occurrence of such a mass, with the previous two instances requiring lancing in the emergency room. The first occurrence in 2013 was severe, with the patient hospitalized for eleven days due to complications from the mass. The most recent occurrence was four months ago. He responded well to Augmentin. He was referred to general surgery, but nothing further was done at that appointment as area had resolved.           ROS All review of systems negative except what is listed in the HPI      Objective:    BP 139/80   Pulse 100   Temp 98.2 F (36.8 C) (Oral)   Ht 5\' 11"  (1.803 m)   Wt 243 lb (110.2 kg)   SpO2 98%   BMI 33.89 kg/m    Physical Exam Vitals reviewed.  Constitutional:      General: He is not in acute distress.    Appearance: Normal appearance. He is not ill-appearing.  Genitourinary:    Comments: Generalized right perirectal area edema/induration, but no obvious fluctuance, no warmth or erythema; mild tenderness to palpation  Neurological:     Mental Status: He is alert and oriented to person, place, and time.  Psychiatric:        Mood and Affect: Mood normal.        Behavior: Behavior normal.        Thought Content: Thought content normal.        Judgment: Judgment  normal.     No results found for any visits on 01/31/23.      Assessment & Plan:   Problem List Items Addressed This Visit     Perirectal abscess - Primary No obvious fluctuance today. Defer I&D for now. Will go ahead and start Augmentin as he responded well to this last time. Warm compress/sitz bath 3-4 times per day. Follow-up with PCP next week to reevaluate.    Relevant Medications   amoxicillin-clavulanate (AUGMENTIN) 875-125 MG tablet    Meds ordered this encounter  Medications   amoxicillin-clavulanate (AUGMENTIN) 875-125 MG tablet    Sig: Take 1 tablet by mouth 2 (two) times daily for 7 days.    Dispense:  14 tablet    Refill:  0    Order Specific Question:   Supervising Provider    Answer:   Danise Edge A [[id number]]    Return in about 5 days (around 02/05/2023) for abscess follow-up PCP.  Clayborne Dana, NP

## 2023-01-31 NOTE — Patient Instructions (Signed)
You responded well to Augmentin last time, let's try that again. Continue warm compresses or sitz baths 3-4 times per day (careful not to burn your skin).  Follow-up with Dr. Carmelia Roller next week to reevaluate.

## 2023-02-01 ENCOUNTER — Ambulatory Visit (HOSPITAL_BASED_OUTPATIENT_CLINIC_OR_DEPARTMENT_OTHER)
Admission: RE | Admit: 2023-02-01 | Discharge: 2023-02-01 | Disposition: A | Discharge status: Home / Self Care | Payer: Medicare Other | Source: Ambulatory Visit | Attending: Family | Admitting: Family

## 2023-02-01 ENCOUNTER — Ambulatory Visit (HOSPITAL_COMMUNITY)
Admission: RE | Admit: 2023-02-01 | Discharge: 2023-02-01 | Disposition: A | Discharge status: Home / Self Care | Payer: Medicare Other | Source: Ambulatory Visit | Attending: Family | Admitting: Family

## 2023-02-01 ENCOUNTER — Other Ambulatory Visit (HOSPITAL_BASED_OUTPATIENT_CLINIC_OR_DEPARTMENT_OTHER): Payer: Self-pay | Admitting: Family

## 2023-02-01 ENCOUNTER — Telehealth (HOSPITAL_BASED_OUTPATIENT_CLINIC_OR_DEPARTMENT_OTHER): Payer: Self-pay | Admitting: *Deleted

## 2023-02-01 DIAGNOSIS — I6523 Occlusion and stenosis of bilateral carotid arteries: Secondary | ICD-10-CM

## 2023-02-01 DIAGNOSIS — E785 Hyperlipidemia, unspecified: Secondary | ICD-10-CM

## 2023-02-01 DIAGNOSIS — I6522 Occlusion and stenosis of left carotid artery: Secondary | ICD-10-CM | POA: Insufficient documentation

## 2023-02-01 DIAGNOSIS — I700 Atherosclerosis of aorta: Secondary | ICD-10-CM

## 2023-02-01 DIAGNOSIS — I10 Essential (primary) hypertension: Secondary | ICD-10-CM | POA: Diagnosis not present

## 2023-02-01 NOTE — Telephone Encounter (Signed)
Left message to call back  

## 2023-02-01 NOTE — Telephone Encounter (Signed)
-----   Message from Laurann [place], [place] sent at 02/01/2023  1:53 PM EDT ----- Covering Caitlin's inbox. Patient also has renal artery duplex result that I'll be sending a note on.  Please let him know carotid duplex showed minor plaque buildup at the low end of narrowing on the left side. He does have some disturbed flow in the right subclavian artery but the flow in the brain arteries is normal forward flow which is good. Please ask him to check his BP in both arms and report back so we can see if there is a difference and also check if he is having any symptoms of dizziness or nausea specifically when lifting arms over his head or any issues with arm pain. If there is >10-15 mmHg BP difference between his arms or any of these symptoms, would schedule for OV to discuss.  His care team may choose to perform periodic surveillance on his carotid arteries but wouldn't be necessary for at least a year, and he is already on statin, so can review further at next OV. Looks to have pharmD follow-up in July for BP but would also schedule 6 month APP/MD follow-up in 06/2023 unless sooner prompted by question above.

## 2023-02-01 NOTE — Telephone Encounter (Signed)
-----   Message from Laurann [place], [place] sent at 02/01/2023  1:57 PM EDT ----- Covering Jose Roberts's inbox. Patient also has carotid artery duplex result note.  Please let patient know renal artery duplex showed mild narrowing but no significant blockage in kidney arteries. Reassuring report. There were minor findings on this study I discussed with PV reader Dr. 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 Already on statin therapy.  See carotid artery duplex note regarding follow-up recs.

## 2023-02-04 NOTE — Telephone Encounter (Signed)
Consulted with Gillian Shields, NP, will await updated results and will contact patient.

## 2023-02-04 NOTE — Telephone Encounter (Signed)
Results called to patient who verbalizes understanding!       Please let patient know renal artery duplex showed mild narrowing but no significant blockage in kidney arteries. Reassuring report. There were minor findings on this study I discussed with PV reader Dr. 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 Already on statin therapy.   Right subclavian artery with stenosis which is likely the cause of his asymmetrical blood pressure. Recommend he check BP in right arm at home as this is higher arm.   Has follow up  with Pharmd 03/06/23 and will have follow up  with Dr. Duke Salvia 2 months after that.   Will ask nursing team to call to assess for any symptoms of dizziness or nausea particularly with lifting arms above head. If noted, plan to refer to [facility]. If not noted, plan to continue monitoring.

## 2023-02-06 ENCOUNTER — Ambulatory Visit (INDEPENDENT_AMBULATORY_CARE_PROVIDER_SITE_OTHER): Payer: Self-pay | Admitting: Pharmacist

## 2023-02-06 DIAGNOSIS — E785 Hyperlipidemia, unspecified: Secondary | ICD-10-CM

## 2023-02-06 DIAGNOSIS — E1169 Type 2 diabetes mellitus with other specified complication: Secondary | ICD-10-CM

## 2023-02-06 DIAGNOSIS — I10 Essential (primary) hypertension: Secondary | ICD-10-CM

## 2023-02-06 DIAGNOSIS — E669 Obesity, unspecified: Secondary | ICD-10-CM

## 2023-02-06 MED ORDER — PRODIGY NO CODING BLOOD GLUC VI STRP
ORAL_STRIP | 3 refills | Status: DC
Start: 2023-02-06 — End: 2023-06-18

## 2023-02-06 NOTE — Progress Notes (Signed)
02/06/2023 Name: Jose Roberts MRN: [id number] DOB: Aug 02, 1946  Chief Complaint  Patient presents with   Medication Management    Follow up    Jose Roberts is a 77 y.o. year old male who presented for a telephone visit.   He was identified by the Clinical Pharmacist Practitioner by a quality report for assistance in managing diabetes, hypertension, hyperlipidemia, and complex medication management.   Primary Care Provider: Sharlene Dory, DO ; Next Scheduled Visit: 04/2023  Medication Access/Adherence  Current Pharmacy:  [facility]) - [place], [place] - [address] [facility] [address] [place] [address] [place] [address] Phone: [phone] Fax: [phone]  [place], [place] - [address] [place] [address] Phone: [phone] Fax: [phone]  [facility] - [place], [place] - [address] [place] [address] Phone: [phone] Fax: [phone]  [email or web address] [place]y Fried[place], [place] - [address] [place] [address] Phone: [phone] Fax: [phone]  [facility] [address] [place], [place] - [address] [place] [address] Phone: [phone] Fax: [phone]   Patient reported affordability concerns with their medications:  previously in 2023 but this year most of his medications are $0 copay ; Patient failed MAC measure in 2023.  Patient reports access/transportation concerns to their pharmacy: Yes  patient is legally blind since 77 yo Patient reports adherence concerns with their medications:  No     Diabetes:  Current medications:  Metformin ER 500mg , pioglitazone 30mg  and Levemir insulin 30 units (med list has 38 units but patient is afraid of hypoglycemia so has only been taking 30 units once a day). He will switch to [place] 30 units daily once  current supply of Levemir is used. He will need Rx for Guinea-Bissau soon.    Current glucose readings: 90 to 170  Using prodigy voice / talking meter; testing up to 3 times daily   Patient denies hypoglycemic s/sx including no dizziness, shakiness, sweating. Patient reports hyperglycemic symptoms - no polyuria, polydipsia, polyphagia, nocturia or blurred vision but he has experience some neuropathy.  He tried gabapentin but caused dizziness and he had a fall. He also tried venlafaxine 35.7mg  daily - patient took for 1 month but did not feel that it helped with neuropathy.  Hypertension:  Current medications: valsartan-amlodipine-hydrochlorothiazide 160-10-25mg  combo prescribed yesterday by cardiology but he has not started yet. He is still taking separate agents but plans to start combo soon. He would like Rx transferred from [facility] to [place].    BP Readings from Last 3 Encounters:  01/31/23 139/80  01/16/23 128/84  01/10/23 125/76    Patient has a validated, automated, upper arm home BP cuff Current blood pressure readings readings: Reports blood pressure usually 120 to 135 / 75 to 80's   Patient denies hypotensive s/sx including no dizziness, lightheadedness except he did have some dizziness when taking gabapentin - since stopping dizziness has resolved.  Patient denies hypertensive symptoms including no headache, chest pain, shortness of breath   Hyperlipidemia/ASCVD Risk Reduction  Current lipid lowering medications: pravastatin 40mg  daily  Medications tried in the past: simvastatin and simvastatin + Ezetimibe (Vytorin)  Antiplatelet regimen: none  ASCVD History: none Risk Factors: DM, hypertension   Medication Management:  Current adherence strategy:  none  Patient reports Good adherence to medications  Patient reports the  following barriers to adherence:  Blindness / vision impairment Complex medication regimen Difficult to administer medication due to vision  impairment. Independent pausing, stopping or controlling of the medication. Lack of knowledge and confidence in self-management. Low understanding of medications benefits. Lack of transportation Lack of health insurance / under insured - improved for 2024   Objective:  Lab Results  Component Value Date   HGBA1C 7.6 (H) 10/31/2022    Lab Results  Component Value Date   CREATININE 1.15 01/10/2023   BUN 13 01/10/2023   NA 141 01/10/2023   K 4.7 01/10/2023   CL 101 01/10/2023   CO2 23 01/10/2023    Lab Results  Component Value Date   CHOL 128 10/31/2022   HDL 51.10 10/31/2022   LDLCALC 64 10/31/2022   TRIG 64.0 10/31/2022   CHOLHDL 2 10/31/2022    Medications Reviewed Today     Reviewed by Henrene Pastor, RPH-CPP (Pharmacist) on 02/06/23 at 1347  Med List Status: <None>   Medication Order Taking? Sig Documenting Provider Last Dose Status Informant  amLODIPine-Valsartan-HCTZ 10-160-25 MG TABS [id number] Yes TAKE 1 TABLET BY MOUTH DAILY Alver Sorrow, NP Taking Active            Med Note Clydie Braun, [place] B   Wed Jan 16, 2023  1:51 PM) 01/16/23-Still taking separate pills until completes current prescriptions  amoxicillin-clavulanate (AUGMENTIN) 875-125 MG tablet [id number] Yes Take 1 tablet by mouth 2 (two) times daily for 7 days. Clayborne Dana, NP Taking Active   gabapentin (NEURONTIN) 300 MG capsule [id number] No Take 1 capsule (300 mg total) by mouth 2 (two) times daily.  Patient not taking: Reported on 01/31/2023   Sharlene Dory, DO Not Taking Active            Med Note Clydie Braun, [place] B   Wed Feb 06, 2023  1:40 PM) Patient stopped due to dizziness.  glucose blood (PRODIGY NO CODING BLOOD GLUC) test strip [id number] Yes USE TO CHECK BLOOD SUGAR 3  TIMES DAILY Carmelia Roller Jilda Roche, DO Taking Active   ibuprofen (ADVIL) 200 MG tablet [id number]  Take 400 mg by mouth every 6 (six) hours as needed. [provider]  Active   Insulin Pen Needle (PEN NEEDLES)  32G X 4 MM MISC [id number] Yes Inject 35 Units as directed daily. To use with levemir Carmelia Roller Jilda Roche, DO Taking Active   metFORMIN (GLUCOPHAGE-XR) 500 MG 24 hr tablet [id number] Yes TAKE 2 TABLETS BY MOUTH TWICE DAILY WITH BREAKFAST AND WITH SUPPER Sharlene Dory, DO Taking Active   Multiple Vitamin (MULTIVITAMIN WITH MINERALS) TABS [id number] Yes Take 1 tablet by mouth daily. [provider] Taking Active Self  pioglitazone (ACTOS) 30 MG tablet [id number] Yes Take 1 tablet (30 mg total) by mouth daily. Sharlene Dory, DO Taking Active   pravastatin (PRAVACHOL) 40 MG tablet [id number] Yes Take 1 tablet (40 mg total) by mouth daily. Sharlene Dory, DO Taking Active   sildenafil (VIAGRA) 100 MG tablet [id number] Yes TAKE 1 TABLET (100 MG TOTAL) BY MOUTH AS NEEDED FOR ERECTILE DYSFUNCTION. Sharlene Dory, DO Taking Active   TRESIBA FLEXTOUCH 100 UNIT/ML FlexTouch Pen [id number] Yes Inject 38 Units into the skin daily. Sharlene Dory, DO Taking Active            Med Note Clydie Braun, [place] Pulse B   Wed Jan 16, 2023  1:50 PM) 01/16/2023- taking Levemir 30 units until he finishes current dose.   venlafaxine XR (EFFEXOR-XR) 37.5 MG  24 hr capsule [id number] No Take 37.5 mg by mouth daily with breakfast.  Patient not taking: Reported on 02/06/2023   [provider] Not Taking Active             Assessment/Plan:   Diabetes: A1c not at goal.  - Reviewed goal A1c, goal fasting, and goal 2 hour post prandial glucose - Recommend to continue current medication for diabetes - Patient will start Guinea-Bissau after he completes current dose of Levemir  - [facility] - Recommend to check glucose daily each morning but to add checking in evening or after a meal 1 or 2 times per week.   Hypertension:  office blood pressure is usually at goal of < 130/80  - Reviewed long term cardiovascular and renal outcomes of uncontrolled blood pressure - Reviewed  appropriate blood pressure monitoring technique and reviewed goal blood pressure. Recommended to check home blood pressure and heart rate 1 or 2 times per week. - Recommend to start combo blood pressure medication - valsartan-amlodipine-hydrochlorothiazide once daily. [facility]ed [facility] from [facility]   Hyperlipidemia/ASCVD Risk Reduction: Last LDL was at goal; Noted that patient had low adhernece for statin in 2023. - Reviewed long term complications of uncontrolled cholesterol - Recommend to continue pravastatin daily. Request transfer per patient from [facility] to [place].    Medication Management: - Adherence has improved since patient's new Medicare plan has lower or no copay for 2024.  - Contacted [facility] regarding transferring all medications from [facility];  Patient will continue to get insulin - Evaristo Bury and testing suppied from [facility] - sent in updated Rx for test strips and requested fill for Guinea-Bissau.  Meds ordered this encounter  Medications   glucose blood (PRODIGY NO CODING BLOOD GLUC) test strip    Sig: USE TO CHECK blood glucose / sugar 3  TIMES DAILY    Dispense:  300 strip    Refill:  3     Follow Up Plan: 2 to 3 months to monitor adherence and BP  Henrene Pastor, PharmD Clinical Pharmacist [facility]

## 2023-02-12 ENCOUNTER — Telehealth: Payer: Self-pay | Admitting: Family Medicine

## 2023-02-12 DIAGNOSIS — E1169 Type 2 diabetes mellitus with other specified complication: Secondary | ICD-10-CM

## 2023-02-12 MED ORDER — TRESIBA FLEXTOUCH 100 UNIT/ML ~~LOC~~ SOPN
30.0000 [IU] | PEN_INJECTOR | Freq: Every day | SUBCUTANEOUS | 2 refills | Status: DC
Start: 2023-02-12 — End: 2023-04-30

## 2023-02-12 NOTE — Addendum Note (Signed)
Addended by: Radene Gunning on: 02/12/2023 10:31 AM   Modules accepted: Orders

## 2023-02-12 NOTE — Telephone Encounter (Signed)
Just have him switch to the Guinea-B[place]au 30 u nightly and monitor sugars.

## 2023-02-12 NOTE — Telephone Encounter (Signed)
Pt called stating that he needed to have some clarification regarding on of his medications. Pt stated he would like a call back after lunch to go over this info.

## 2023-02-12 NOTE — Telephone Encounter (Signed)
Patient received Jose Roberts and was told was that it was stronger than Levemir (previously on). He has dropped down to taking 30 units per day/previous was 38 units. He needs PCP to let him know what he is to take because he is concerned he may not be taking the correct units. BS this morning was 106 and yesterday 92. Due to his work schedule he is only taking in the mornings

## 2023-02-12 NOTE — Telephone Encounter (Signed)
Patient informed of PCP instructions. He verbalized understanding. 

## 2023-03-06 ENCOUNTER — Ambulatory Visit (HOSPITAL_BASED_OUTPATIENT_CLINIC_OR_DEPARTMENT_OTHER): Payer: Medicare Other

## 2023-03-28 ENCOUNTER — Encounter (INDEPENDENT_AMBULATORY_CARE_PROVIDER_SITE_OTHER): Payer: Self-pay

## 2023-04-10 ENCOUNTER — Telehealth: Payer: Medicare Other

## 2023-04-11 ENCOUNTER — Other Ambulatory Visit: Payer: Self-pay | Admitting: Family Medicine

## 2023-04-30 ENCOUNTER — Other Ambulatory Visit: Payer: Self-pay | Admitting: Family Medicine

## 2023-04-30 DIAGNOSIS — E1169 Type 2 diabetes mellitus with other specified complication: Secondary | ICD-10-CM

## 2023-05-07 ENCOUNTER — Ambulatory Visit: Payer: Medicare Other | Admitting: Family Medicine

## 2023-05-07 ENCOUNTER — Encounter: Payer: Self-pay | Admitting: Family Medicine

## 2023-05-07 VITALS — BP 120/80 | HR 84 | Temp 97.9°F | Ht 71.0 in | Wt 239.4 lb

## 2023-05-07 DIAGNOSIS — E1169 Type 2 diabetes mellitus with other specified complication: Secondary | ICD-10-CM

## 2023-05-07 DIAGNOSIS — Z0001 Encounter for general adult medical examination with abnormal findings: Secondary | ICD-10-CM

## 2023-05-07 DIAGNOSIS — K644 Residual hemorrhoidal skin tags: Secondary | ICD-10-CM

## 2023-05-07 DIAGNOSIS — Z23 Encounter for immunization: Secondary | ICD-10-CM | POA: Diagnosis not present

## 2023-05-07 DIAGNOSIS — Z794 Long term (current) use of insulin: Secondary | ICD-10-CM | POA: Diagnosis not present

## 2023-05-07 DIAGNOSIS — E669 Obesity, unspecified: Secondary | ICD-10-CM | POA: Diagnosis not present

## 2023-05-07 DIAGNOSIS — Z6833 Body mass index (BMI) 33.0-33.9, adult: Secondary | ICD-10-CM

## 2023-05-07 DIAGNOSIS — Z0000 Encounter for general adult medical examination without abnormal findings: Secondary | ICD-10-CM

## 2023-05-07 LAB — COMPREHENSIVE METABOLIC PANEL
ALT: 14 U/L (ref 0–53)
AST: 20 U/L (ref 0–37)
Albumin: 3.9 g/dL (ref 3.5–5.2)
Alkaline Phosphatase: 68 U/L (ref 39–117)
BUN: 19 mg/dL (ref 6–23)
CO2: 29 mEq/L (ref 19–32)
Calcium: 9.9 mg/dL (ref 8.4–10.5)
Chloride: 101 mEq/L (ref 96–112)
Creatinine, Ser: 1.15 mg/dL (ref 0.40–1.50)
GFR: 62.11 mL/min (ref >60.00)
Glucose, Bld: 95 mg/dL (ref 70–99)
Potassium: 4.2 mEq/L (ref 3.5–5.1)
Sodium: 138 mEq/L (ref 135–145)
Total Bilirubin: 0.8 mg/dL (ref 0.2–1.2)
Total Protein: 7.4 g/dL (ref 6.0–8.3)

## 2023-05-07 LAB — LIPID PANEL
Cholesterol: 143 mg/dL (ref 0–200)
HDL: 57.4 mg/dL (ref >39.00)
LDL Cholesterol: 75 mg/dL (ref 0–99)
NonHDL: 85.84
Total CHOL/HDL Ratio: 2
Triglycerides: 53 mg/dL (ref 0.0–149.0)
VLDL: 10.6 mg/dL (ref 0.0–40.0)

## 2023-05-07 LAB — MICROALBUMIN / CREATININE URINE RATIO
Creatinine,U: 116.4 mg/dL
Microalb Creat Ratio: 1 mg/g (ref 0.0–30.0)
Microalb, Ur: 1.1 mg/dL (ref 0.0–1.9)

## 2023-05-07 LAB — CBC
HCT: 45.3 % (ref 39.0–52.0)
Hemoglobin: 14.2 g/dL (ref 13.0–17.0)
MCHC: 31.4 g/dL (ref 30.0–36.0)
MCV: 86.2 fl (ref 78.0–100.0)
Platelets: 346 10*3/uL (ref 150.0–400.0)
RBC: 5.26 Mil/uL (ref 4.22–5.81)
RDW: 14.9 % (ref 11.5–15.5)
WBC: 8.4 10*3/uL (ref 4.0–10.5)

## 2023-05-07 LAB — HEMOGLOBIN A1C: Hgb A1c MFr Bld: 6.7 % — ABNORMAL HIGH (ref 4.6–6.5)

## 2023-05-07 MED ORDER — PRAVASTATIN SODIUM 40 MG PO TABS: 40.0000 mg | ORAL_TABLET | Freq: Every day | ORAL | 2 refills | Status: DC

## 2023-05-07 MED ORDER — METFORMIN HCL ER 500 MG PO TB24: 500.0000 mg | ORAL_TABLET | Freq: Every day | ORAL | Status: DC

## 2023-05-07 MED ORDER — TRESIBA FLEXTOUCH 100 UNIT/ML ~~LOC~~ SOPN
18.0000 [IU] | PEN_INJECTOR | Freq: Every day | SUBCUTANEOUS | Status: AC
Start: 2023-05-07 — End: ?

## 2023-05-07 MED ORDER — HYDROCORTISONE 2.5 % EX CREA
TOPICAL_CREAM | Freq: Two times a day (BID) | CUTANEOUS | 0 refills | Status: DC
Start: 2023-05-07 — End: 2023-11-04

## 2023-05-07 NOTE — Progress Notes (Signed)
Chief Complaint  Patient presents with   Annual Exam    Hemorrhoid    Insomnia    Well Male Jose Roberts is here for a complete physical.   His last physical was >1 year ago.  Current diet: in general, a "healthy" diet.   Current exercise: none Weight trend: stable Fatigue out of ordinary? No. Seat belt? Yes.   Advanced directive? No  Health maintenance Shingrix- No Colonoscopy- Yes Tetanus- No Hep C- Yes Pneumonia vaccine- Yes  Hemorrhoid- painful hemorrhoid for the past few days. No injury or bleeding. Using OTC Preparation H w some improvement. No abd pain.   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 02/19/2011   Hemorrhoids    Hypertension      Past Surgical History:  Procedure Laterality Date   EYE SURGERY     HEMORRHOID SURGERY     revision   RETINAL DETACHMENT SURGERY     surgery on gangrene  May 6/7/8, 2012 (x 3 debridements)   Fournier's Gangrene of perineum    Medications  Current Outpatient Medications on File Prior to Visit  Medication Sig Dispense Refill   amLODIPine-Valsartan-HCTZ 10-160-25 MG TABS TAKE 1 TABLET BY MOUTH DAILY 90 tablet 0   glucose blood (PRODIGY NO CODING BLOOD GLUC) test strip USE TO CHECK blood glucose / sugar 3  TIMES DAILY 300 strip 3   ibuprofen (ADVIL) 200 MG tablet Take 400 mg by mouth every 6 (six) hours as needed.     Insulin Pen Needle (PEN NEEDLES) 32G X 4 MM MISC Inject 35 Units as directed daily. To use with levemir 90 each 0   metFORMIN (GLUCOPHAGE-XR) 500 MG 24 hr tablet TAKE 2 TABLETS BY MOUTH TWICE DAILY WITH BREAKFAST AND WITH SUPPER 400 tablet 0   Multiple Vitamin (MULTIVITAMIN WITH MINERALS) TABS Take 1 tablet by mouth daily.     pioglitazone (ACTOS) 30 MG tablet Take 1 tablet by mouth daily. 90 tablet 0   pravastatin (PRAVACHOL) 40 MG tablet Take 1 tablet (40 mg total) by mouth daily. 100 tablet 0   sildenafil (VIAGRA) 100 MG  tablet TAKE 1 TABLET (100 MG TOTAL) BY MOUTH AS NEEDED FOR ERECTILE DYSFUNCTION. 30 tablet 2   TRESIBA FLEXTOUCH 100 UNIT/ML FlexTouch Pen INJECT SUBCUTANEOUSLY 38 UNITS  DAILY 45 mL 2    Allergies No Known Allergies  Family History Family History  Problem Relation Age of Onset   Diabetes Mother    Hypertension Mother    Cancer Mother        pancreas   Heart failure Father    Diabetes Father    Hypertension Father    Diabetes Sister    Diabetes Brother     Review of Systems: Constitutional:  no fevers Eye:  no recent significant change in vision Ears:  No changes in hearing Nose/Mouth/Throat:  no complaints of nasal congestion, no sore throat Cardiovascular: no chest pain Respiratory:  No shortness of breath Gastrointestinal:  +hemorrhoids GU:  No frequency Integumentary:  no abnormal skin lesions reported Neurologic:  no headaches Endocrine:  denies unexplained weight changes  Exam BP 120/80 (BP Location: Left Arm, Patient Position: Sitting, Cuff Size: Large)   Pulse 84   Temp 97.9 F (36.6 C) (Oral)   Ht 5\' 11"  (1.803 m)   Wt 239 lb 6 oz (108.6 kg)   SpO2 98%  BMI 33.39 kg/m  General:  well developed, well nourished, in no apparent distress Skin:  no significant moles, warts, or growths Head:  no masses, lesions, or tenderness Eyes:  pupils opaque Ears:  canals without lesions, TMs shiny without retraction, no obvious effusion, no erythema Nose:  nares patent, mucosa normal Throat/Pharynx:  lips and gingiva without lesion; tongue and uvula midline; non-inflamed pharynx; no exudates or postnasal drainage Lungs:  clear to auscultation, breath sounds equal bilaterally, no respiratory distress Cardio:  regular rate and rhythm, no LE edema or bruits Rectal: ext hemorrhoid noted over R lateral portion of anal opening with mild ttp and without thrombosis. No active bleeding or fissures.  GI: BS+, S, NT, ND, no masses or organomegaly Musculoskeletal:  symmetrical  muscle groups noted without atrophy or deformity Neuro:  gait normal; deep tendon reflexes normal and symmetric Psych: well oriented with normal range of affect and appropriate judgment/insight  Assessment and Plan  Well adult exam  Type 2 diabetes mellitus with obesity (HCC) - Plan: CBC, Comprehensive metabolic panel, Lipid panel, Hemoglobin A1c, Microalbumin / creatinine urine ratio, TRESIBA FLEXTOUCH 100 UNIT/ML FlexTouch Pen  External hemorrhoid - Plan: hydrocortisone 2.5 % cream  Need for influenza vaccination - Plan: Flu Vaccine Trivalent High Dose (Fluad)   Well 77 y.o. male. Counseled on diet and exercise. Other orders as above. Tdap rec'd to get at pharmacy.  Flu shot today. Hemorrhoid: topical hydrocortisone cream 2.5% bid for a week. Stay hydrated. Send message if not improving.  Follow up in 3-6 mo.  The patient voiced understanding and agreement to the plan.  Jilda Roche [place], DO 05/07/23 12:20 PM

## 2023-05-07 NOTE — Patient Instructions (Addendum)
Give [place] 2-3 business days to get the results of your labs back.   Keep the diet clean and stay active.  Aim to do some physical exertion for 150 minutes per week. This is typically divided into 5 days per week, 30 minutes per day. The activity should be enough to get your heart rate up. Anything is better than nothing if you have time constraints.  Please get me a copy of your advanced directive form at your convenience.   Please get your tetanus shot at the pharmacy.   Let us know if you need anything.

## 2023-05-10 ENCOUNTER — Telehealth: Payer: Self-pay | Admitting: Family Medicine

## 2023-05-10 NOTE — Telephone Encounter (Signed)
Mailed letter °

## 2023-05-10 NOTE — Telephone Encounter (Signed)
Pt requesting a letter to be mailed with his results.

## 2023-05-12 ENCOUNTER — Other Ambulatory Visit (HOSPITAL_BASED_OUTPATIENT_CLINIC_OR_DEPARTMENT_OTHER): Payer: Self-pay | Admitting: Family

## 2023-05-12 ENCOUNTER — Other Ambulatory Visit: Payer: Self-pay | Admitting: Family Medicine

## 2023-05-12 DIAGNOSIS — I10 Essential (primary) hypertension: Secondary | ICD-10-CM

## 2023-05-13 ENCOUNTER — Other Ambulatory Visit: Payer: Self-pay | Admitting: Family Medicine

## 2023-05-13 DIAGNOSIS — I10 Essential (primary) hypertension: Secondary | ICD-10-CM

## 2023-05-13 NOTE — Telephone Encounter (Signed)
Advise on this refill, not filled by PCP

## 2023-06-09 ENCOUNTER — Other Ambulatory Visit (HOSPITAL_BASED_OUTPATIENT_CLINIC_OR_DEPARTMENT_OTHER): Payer: Self-pay | Admitting: Family

## 2023-06-09 DIAGNOSIS — I10 Essential (primary) hypertension: Secondary | ICD-10-CM

## 2023-06-13 ENCOUNTER — Telehealth: Payer: Self-pay | Admitting: Family Medicine

## 2023-06-13 ENCOUNTER — Other Ambulatory Visit (HOSPITAL_BASED_OUTPATIENT_CLINIC_OR_DEPARTMENT_OTHER): Payer: Self-pay | Admitting: Family

## 2023-06-13 DIAGNOSIS — I10 Essential (primary) hypertension: Secondary | ICD-10-CM

## 2023-06-13 NOTE — Telephone Encounter (Signed)
Received message that preferred pharmacy did not received e-prescribed refusal due to communication error. Pharmacy called and made aware that patient needed an appointment for further refills.

## 2023-06-13 NOTE — Telephone Encounter (Signed)
Called pt spoke with him regarding BP medication and he was advised that his cardiology Alver Sorrow, NP denied the medication. Pt stated he don't want to see them, he was advised will need appt to discuss refills about Rx changes.

## 2023-06-13 NOTE — Telephone Encounter (Signed)
Pt called and stated that per [facility] pharmacy, they are still saying that his blood pressure medication is still being denied from Dr. Hollie Beach office. Pt explained that during his last visit with pcp, this matter was discussed. Pt will be out of his medication by this weekend. Please call an advise.

## 2023-06-17 ENCOUNTER — Encounter: Payer: Self-pay | Admitting: Family Medicine

## 2023-06-17 ENCOUNTER — Other Ambulatory Visit: Payer: Self-pay | Admitting: Family Medicine

## 2023-06-17 ENCOUNTER — Ambulatory Visit (INDEPENDENT_AMBULATORY_CARE_PROVIDER_SITE_OTHER): Payer: Medicare Other | Admitting: Family Medicine

## 2023-06-17 DIAGNOSIS — I10 Essential (primary) hypertension: Secondary | ICD-10-CM | POA: Diagnosis not present

## 2023-06-17 DIAGNOSIS — E669 Obesity, unspecified: Secondary | ICD-10-CM

## 2023-06-17 MED ORDER — AMLODIPINE-VALSARTAN-HCTZ 10-160-25 MG PO TABS: 1.0000 | ORAL_TABLET | Freq: Every day | ORAL | 3 refills | Status: DC

## 2023-06-17 MED ORDER — AMLODIPINE-VALSARTAN-HCTZ 10-160-25 MG PO TABS
1.0000 | ORAL_TABLET | Freq: Every day | ORAL | 1 refills | Status: DC
Start: 2023-06-17 — End: 2023-11-04

## 2023-06-17 NOTE — Patient Instructions (Signed)
Keep the diet clean and stay active.  Let us know if you need anything. 

## 2023-06-17 NOTE — Progress Notes (Signed)
Chief Complaint  Patient presents with   Medication Refill    Medication Refill BP    Subjective Jose Roberts is a 77 y.o. male who presents for hypertension follow up. He does monitor home blood pressures. Blood pressures ranging from 120's/70's on average. He is compliant with medications- amlodipine-valsartan-hydrochlorothiazide 10-160-25 mg/d. Patient has these side effects of medication: none He is usually adhering to a healthy diet overall. Current exercise: walking No CP or SOB.    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 02/19/2011   Hemorrhoids    Hypertension     Exam BP 124/78 (BP Location: Left Arm, Patient Position: Sitting, Cuff Size: Normal)   Pulse 70   Temp 98 F (36.7 C) (Oral)   Resp 16   Ht 5\' 11"  (1.803 m)   Wt 238 lb 9.6 oz (108.2 kg)   SpO2 96%   BMI 33.28 kg/m  General:  well developed, well nourished, in no apparent distress Heart: RRR, no bruits, 2+ pitting b/l LE edema tapering at mid tibia Lungs: clear to auscultation, no accessory muscle use Psych: well oriented with normal range of affect and appropriate judgment/insight  Essential hypertension - Plan: amLODIPine-Valsartan-HCTZ 10-160-25 MG TABS  Chronic, stable. Cont amlodipine-valsartan-hydrochlorothiazide 10-160-25 mg/d. Counseled on diet and exercise. F/u as originally scheduled. The patient voiced understanding and agreement to the plan.  Jilda Roche [place], DO 06/17/23  8:17 AM

## 2023-08-25 ENCOUNTER — Other Ambulatory Visit: Payer: Self-pay | Admitting: Family Medicine

## 2023-09-09 ENCOUNTER — Telehealth: Payer: Self-pay | Admitting: Family Medicine

## 2023-09-09 NOTE — Telephone Encounter (Signed)
Copied from CRM [id number]. Topic: Medicare AWV >> Sep 09, 2023 10:15 AM Payton Doughty wrote: Reason for CRM: Called LVM 09/09/2023 to schedule AWV. Please schedule Virtual or Telehealth visits ONLY.   Verlee Rossetti; Care Guide Ambulatory Clinical Support [place] [facility] Direct Dial: [phone]

## 2023-11-04 ENCOUNTER — Encounter: Payer: Self-pay | Admitting: Family Medicine

## 2023-11-04 ENCOUNTER — Ambulatory Visit (INDEPENDENT_AMBULATORY_CARE_PROVIDER_SITE_OTHER): Payer: Medicare Other | Admitting: Family Medicine

## 2023-11-04 ENCOUNTER — Other Ambulatory Visit: Payer: Self-pay | Admitting: Family Medicine

## 2023-11-04 VITALS — BP 130/74 | HR 73 | Temp 97.7°F | Resp 16 | Ht 71.0 in | Wt 251.0 lb

## 2023-11-04 DIAGNOSIS — E785 Hyperlipidemia, unspecified: Secondary | ICD-10-CM | POA: Diagnosis not present

## 2023-11-04 DIAGNOSIS — Z6835 Body mass index (BMI) 35.0-35.9, adult: Secondary | ICD-10-CM | POA: Diagnosis not present

## 2023-11-04 DIAGNOSIS — E669 Obesity, unspecified: Secondary | ICD-10-CM | POA: Diagnosis not present

## 2023-11-04 DIAGNOSIS — Z7984 Long term (current) use of oral hypoglycemic drugs: Secondary | ICD-10-CM

## 2023-11-04 DIAGNOSIS — E1169 Type 2 diabetes mellitus with other specified complication: Secondary | ICD-10-CM | POA: Diagnosis not present

## 2023-11-04 DIAGNOSIS — B353 Tinea pedis: Secondary | ICD-10-CM

## 2023-11-04 DIAGNOSIS — I10 Essential (primary) hypertension: Secondary | ICD-10-CM

## 2023-11-04 LAB — COMPREHENSIVE METABOLIC PANEL
ALT: 17 U/L (ref 0–53)
AST: 18 U/L (ref 0–37)
Albumin: 4.2 g/dL (ref 3.5–5.2)
Alkaline Phosphatase: 54 U/L (ref 39–117)
BUN: 15 mg/dL (ref 6–23)
CO2: 30 meq/L (ref 19–32)
Calcium: 10.1 mg/dL (ref 8.4–10.5)
Chloride: 102 meq/L (ref 96–112)
Creatinine, Ser: 1.12 mg/dL (ref 0.40–1.50)
GFR: 63.89 mL/min (ref >60.00)
Glucose, Bld: 71 mg/dL (ref 70–99)
Potassium: 3.9 meq/L (ref 3.5–5.1)
Sodium: 139 meq/L (ref 135–145)
Total Bilirubin: 0.7 mg/dL (ref 0.2–1.2)
Total Protein: 7.5 g/dL (ref 6.0–8.3)

## 2023-11-04 LAB — LIPID PANEL
Cholesterol: 139 mg/dL (ref 0–200)
HDL: 50.7 mg/dL (ref >39.00)
LDL Cholesterol: 76 mg/dL (ref 0–99)
NonHDL: 87.9
Total CHOL/HDL Ratio: 3
Triglycerides: 61 mg/dL (ref 0.0–149.0)
VLDL: 12.2 mg/dL (ref 0.0–40.0)

## 2023-11-04 LAB — HEMOGLOBIN A1C: Hgb A1c MFr Bld: 7.6 % — ABNORMAL HIGH (ref 4.6–6.5)

## 2023-11-04 MED ORDER — METFORMIN HCL ER 500 MG PO TB24: 1000.0000 mg | ORAL_TABLET | Freq: Two times a day (BID) | ORAL | 2 refills | Status: DC

## 2023-11-04 MED ORDER — PIOGLITAZONE HCL 45 MG PO TABS: 45.0000 mg | ORAL_TABLET | Freq: Every day | ORAL | 3 refills | Status: AC

## 2023-11-04 MED ORDER — KETOCONAZOLE 2 % EX CREA: 1.0000 | TOPICAL_CREAM | Freq: Every day | CUTANEOUS | 0 refills | Status: AC

## 2023-11-04 MED ORDER — SILDENAFIL CITRATE 100 MG PO TABS: 100.0000 mg | ORAL_TABLET | ORAL | 2 refills | Status: DC | PRN

## 2023-11-04 NOTE — Patient Instructions (Addendum)
 Give [place] 2-3 business days to get the results of your labs back.   Keep the diet clean and stay active.  Please consider getting your tetanus booster at the pharmacy.  The Shingrix vaccine (for shingles) is a 2 shot series spaced 2-6 months apart. It can make people feel low energy, achy and almost like they have the flu for 48 hours after injection. 1/5 people can have nausea and/or vomiting. Please plan accordingly when deciding on when to get this shot. Call or go to your pharmacy to get this. The second shot of the series is less severe regarding the side effects, but it still lasts 48 hours.   Let us know if you need anything.

## 2023-11-04 NOTE — Progress Notes (Signed)
 Subjective:   Chief Complaint  Patient presents with   Hypertension    Here for follow up   Diabetes    Here for follow up    Jose Roberts is a 78 y.o. male here for follow-up of diabetes.   Jose Roberts's self monitored glucose range is 80-low 100's.  Patient denies hypoglycemic reactions. He checks his glucose levels intermittently Patient does require insulin.  Tresiba 18 u daily.  Medications include: Metformin XR 1000 mg bid, Actos 30 mg/d.  Diet could be better.  Exercise: bowling  Hypertension Patient presents for hypertension follow up. He does monitor home blood pressures. Blood pressures ranging on average from 120's/70's. He is compliant with medications- Exforge-HCT 10-160-25 mg/d. Patient has these side effects of medication: none Diet/exercise as above.  No CP or SOB.   Hyperlipidemia Patient presents for hyperlipidemia follow up. Currently being treated with pravastatin 40 mg daily and compliance with treatment thus far has been good. He denies myalgias. Diet/exercise as above.  The patient is not known to have coexisting coronary artery disease.  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 02/19/2011   Hemorrhoids    Hypertension      Related testing: Retinal exam: N/A-patient is legally blind Pneumovax: done  Objective:  BP 130/74 (BP Location: Left Arm, Cuff Size: Large)   Pulse 73   Temp 97.7 F (36.5 C) (Oral)   Resp 16   Ht 5\' 11"  (1.803 m)   Wt 251 lb (113.9 kg)   SpO2 99%   BMI 35.01 kg/m  General:  Well developed, well nourished, in no apparent distress Skin: macerated tissue between 2/3 and 4/5 digits on R, 1/2, 2/3, 3/4 digits on L foot.Otherwise, warm, no pallor or diaphoresis Lungs:  CTAB, no access msc use Cardio:  RRR, no bruits Neuro:  Sensation intact to pinprick on feet Psych: Age appropriate judgment and insight  Assessment:   Type 2 diabetes  mellitus with obesity (HCC) - Plan: Comprehensive metabolic panel, Lipid panel, Hemoglobin A1c  Essential hypertension  Hyperlipidemia, unspecified hyperlipidemia type  Tinea pedis of both feet   Plan:   Chronic, stable.  Continue Tresiba 18 units nightly, metformin XR 1000 mg twice daily, Actos 30 mg daily.  Counseled on diet and exercise. Chronic, stable.  Continue Exforge HCT 10 - 160 - 25 mg daily. Chronic, stable.  Continue pravastatin 40 mg daily. This booster recommended to get at the pharmacy.  Shingles vaccine recommended to get at the pharmacy. F/u in 6 mo. annual wellness visit at convenience. The patient voiced understanding and agreement to the plan.  Jose Roberts [place], DO 11/04/23 7:18 AM

## 2023-11-24 ENCOUNTER — Other Ambulatory Visit: Payer: Self-pay | Admitting: Family Medicine

## 2023-11-25 ENCOUNTER — Other Ambulatory Visit: Payer: Self-pay | Admitting: Family Medicine

## 2023-11-25 NOTE — Telephone Encounter (Signed)
 Copied from CRM [id number]. Topic: Clinical - Medication Refill >> Nov 25, 2023  3:48 PM Juluis Ok wrote: Most Recent Primary Care Visit:  Provider: Jobe Mulder  Department: [facility]  Visit Type: OFFICE VISIT  Date: 11/04/2023  Medication: pioglitazone (ACTOS) 45 MG tablet  Has the patient contacted their pharmacy? Yes (Agent: If no, request that the patient contact the pharmacy for the refill. If patient does not wish to contact the pharmacy document the reason why and proceed with request.) (Agent: If yes, when and what did the pharmacy advise?)  Is this the correct pharmacy for this prescription? Yes If no, delete pharmacy and type the correct one.  This is the patient's preferred pharmacy:    [facility] [address] [place], [place] - [address] [place] [address] Phone: [phone] Fax: [phone]     Has the prescription been filled recently? No  Is the patient out of the medication? Yes  Has the patient been seen for an appointment in the last year OR does the patient have an upcoming appointment? Yes  Can we respond through MyChart? Yes  Agent: Please be advised that Rx refills may take up to 3 business days. We ask that you follow-up with your pharmacy.

## 2023-11-26 ENCOUNTER — Other Ambulatory Visit: Payer: Self-pay

## 2023-11-26 ENCOUNTER — Telehealth: Payer: Self-pay

## 2023-11-26 MED ORDER — AMLODIPINE-VALSARTAN-HCTZ 10-160-25 MG PO TABS: 1.0000 | ORAL_TABLET | Freq: Every day | ORAL | 3 refills | Status: DC

## 2023-11-26 MED ORDER — AMLODIPINE BESYLATE 10 MG PO TABS: 10.0000 mg | ORAL_TABLET | Freq: Every day | ORAL | 1 refills | Status: DC

## 2023-11-26 MED ORDER — AMLODIPINE BESYLATE 10 MG PO TABS: 10.0000 mg | ORAL_TABLET | Freq: Every day | ORAL | 1 refills | Status: AC

## 2023-11-26 MED ORDER — VALSARTAN 160 MG PO TABS
160.0000 mg | ORAL_TABLET | Freq: Every day | ORAL | 3 refills | Status: AC
Start: 2023-11-26 — End: ?

## 2023-11-26 MED ORDER — VALSARTAN 160 MG PO TABS: 160.0000 mg | ORAL_TABLET | Freq: Every day | ORAL | 3 refills | Status: DC

## 2023-11-26 NOTE — Addendum Note (Signed)
 Addended by: Shray Hunley M on: 11/26/2023 05:24 PM   Modules accepted: Orders

## 2023-11-26 NOTE — Telephone Encounter (Signed)
 Please Advise Copied from CRM [id number]. Topic: Clinical - Prescription Issue >> Nov 26, 2023 12:32 PM Jose Roberts wrote: Reason for CRM:  [facility] called to request a new prescription for Each of the following medication. To be faxed to [phone] amlodipine,10mg    Valsartan mg 160 HCTZ 25 it mus be separate due the insurance no longer covering the medication

## 2023-11-26 NOTE — Telephone Encounter (Signed)
 OK to send in separately. Thx.

## 2023-11-26 NOTE — Telephone Encounter (Signed)
 Medication was ordered and sent

## 2023-11-26 NOTE — Telephone Encounter (Signed)
 Refills sent on 11/04/23 for 90 tablets and 3 refills.

## 2023-11-29 ENCOUNTER — Other Ambulatory Visit: Payer: Self-pay | Admitting: Family Medicine

## 2023-11-29 NOTE — Telephone Encounter (Signed)
 Copied from CRM [id number]. Topic: Clinical - Prescription Issue >> Nov 29, 2023 12:42 PM Jethro Morrison wrote: Reason for CRM: THE PT CALLED ABOUT HIS pioglitazone  (ACTOS ) 45 MG tablet [[id number]] PRESCRIPTION. THE PATIENT STATED HE IS GOING TO BE COMPLETELY OUT AFTER TOMORROW.

## 2023-12-02 ENCOUNTER — Telehealth: Payer: Self-pay

## 2023-12-02 NOTE — Telephone Encounter (Signed)
 Medication is not discontinued, this is an active medication on patient's list. [facility] and they state they have the prescription available [phone]. They state the patient is aware the prescription is with them and that he asked to have it on profile instead of being filled. Called patient to clarify his request, he states he is aware he has the medication available thru [facility] but he states he has been calling asking to have it changed to [facility] in [place]. Informed patient that for future reference he can call [facility] and request that the prescription be pulled. Called patient's preferred [facility] and they state they will try to pull the prescription from [facility] and if they are unable, they were given our call back number and will follow up.  Copied from CRM [id number]. Topic: Clinical - Prescription Issue >> Nov 29, 2023 12:42 PM Jethro Morrison wrote: Reason for CRM: THE PT CALLED ABOUT HIS pioglitazone  (ACTOS ) 45 MG tablet [[id number]] PRESCRIPTION. THE PATIENT STATED HE IS GOING TO BE COMPLETELY OUT AFTER TOMORROW. >> Dec 02, 2023 10:23 AM [place] J wrote: Patient is wondering why his medication has not been sent in. He took his last tablet today. I let him know that the medication was discontinued but he stated that a new medication to replace his pioglitazone  (ACTOS ) 45 MG would be sent in.

## 2024-02-27 ENCOUNTER — Other Ambulatory Visit: Payer: Self-pay | Admitting: Family Medicine

## 2024-03-01 ENCOUNTER — Other Ambulatory Visit: Payer: Self-pay | Admitting: Family Medicine

## 2024-05-13 ENCOUNTER — Encounter: Payer: Self-pay | Admitting: Family Medicine

## 2024-05-13 ENCOUNTER — Ambulatory Visit: Admitting: Family Medicine

## 2024-05-13 VITALS — BP 130/78 | HR 66 | Temp 96.7°F | Resp 16 | Ht 71.0 in | Wt 260.0 lb

## 2024-05-13 DIAGNOSIS — E119 Type 2 diabetes mellitus without complications: Secondary | ICD-10-CM

## 2024-05-13 DIAGNOSIS — E1140 Type 2 diabetes mellitus with diabetic neuropathy, unspecified: Secondary | ICD-10-CM | POA: Diagnosis not present

## 2024-05-13 DIAGNOSIS — Z23 Encounter for immunization: Secondary | ICD-10-CM | POA: Diagnosis not present

## 2024-05-13 DIAGNOSIS — Z794 Long term (current) use of insulin: Secondary | ICD-10-CM | POA: Diagnosis not present

## 2024-05-13 DIAGNOSIS — Z0000 Encounter for general adult medical examination without abnormal findings: Secondary | ICD-10-CM

## 2024-05-13 DIAGNOSIS — E669 Obesity, unspecified: Secondary | ICD-10-CM

## 2024-05-13 DIAGNOSIS — Z6836 Body mass index (BMI) 36.0-36.9, adult: Secondary | ICD-10-CM

## 2024-05-13 LAB — LIPID PANEL
Cholesterol: 138 mg/dL (ref 0–200)
HDL: 54.5 mg/dL (ref >39.00)
LDL Cholesterol: 73 mg/dL (ref 0–99)
NonHDL: 83.55
Total CHOL/HDL Ratio: 3
Triglycerides: 54 mg/dL (ref 0.0–149.0)
VLDL: 10.8 mg/dL (ref 0.0–40.0)

## 2024-05-13 LAB — COMPREHENSIVE METABOLIC PANEL WITH GFR
ALT: 14 U/L (ref 0–53)
AST: 17 U/L (ref 0–37)
Albumin: 3.9 g/dL (ref 3.5–5.2)
Alkaline Phosphatase: 49 U/L (ref 39–117)
BUN: 17 mg/dL (ref 6–23)
CO2: 30 meq/L (ref 19–32)
Calcium: 10 mg/dL (ref 8.4–10.5)
Chloride: 103 meq/L (ref 96–112)
Creatinine, Ser: 1.13 mg/dL (ref 0.40–1.50)
GFR: 62.98 mL/min (ref >60.00)
Glucose, Bld: 80 mg/dL (ref 70–99)
Potassium: 4.2 meq/L (ref 3.5–5.1)
Sodium: 140 meq/L (ref 135–145)
Total Bilirubin: 0.6 mg/dL (ref 0.2–1.2)
Total Protein: 7.2 g/dL (ref 6.0–8.3)

## 2024-05-13 LAB — CBC
HCT: 43.4 % (ref 39.0–52.0)
Hemoglobin: 14.1 g/dL (ref 13.0–17.0)
MCHC: 32.5 g/dL (ref 30.0–36.0)
MCV: 84.4 fl (ref 78.0–100.0)
Platelets: 204 K/uL (ref 150.0–400.0)
RBC: 5.15 Mil/uL (ref 4.22–5.81)
RDW: 14.9 % (ref 11.5–15.5)
WBC: 5.6 K/uL (ref 4.0–10.5)

## 2024-05-13 LAB — VITAMIN B12: Vitamin B-12: 536 pg/mL (ref 211–911)

## 2024-05-13 LAB — HEMOGLOBIN A1C: Hgb A1c MFr Bld: 7.4 % — ABNORMAL HIGH (ref 4.6–6.5)

## 2024-05-13 MED ORDER — METFORMIN HCL ER 500 MG PO TB24: 1000.0000 mg | ORAL_TABLET | Freq: Two times a day (BID) | ORAL | 2 refills | Status: DC

## 2024-05-13 MED ORDER — PRAVASTATIN SODIUM 40 MG PO TABS: 40.0000 mg | ORAL_TABLET | Freq: Every day | ORAL | 3 refills | Status: DC

## 2024-05-13 MED ORDER — SILDENAFIL CITRATE 100 MG PO TABS: 100.0000 mg | ORAL_TABLET | ORAL | 2 refills | Status: AC | PRN

## 2024-05-13 MED ORDER — VENLAFAXINE HCL ER 37.5 MG PO CP24: 37.5000 mg | ORAL_CAPSULE | Freq: Every day | ORAL | 2 refills | Status: AC

## 2024-05-13 NOTE — Progress Notes (Signed)
 Chief Complaint  Patient presents with   Annual Exam    CPE    Well Male Jose Roberts is here for a complete physical.   His last physical was >1 year ago.  Current diet: in general, diet is OK.   Current exercise: none Weight trend: increased Fatigue out of ordinary? No. Seat belt? Yes.   Advanced directive? Yes  Health maintenance Shingrix- Yes Tetanus- Yes Hep C- Yes Pneumonia vaccine- Yes  Patient has had several years of numbness/tingling in both of his feet.  He is a longstanding diabetic.  He was prescribed venlafaxine  in the past but never took it.  He is wondering if he should start taking this.  There is no balance issue.  Patient has had a few weeks of swelling in his legs.  There is no calf pain, chest pain, or shortness of breath.  He was eating a lot of peanuts for a while and started cutting down around a week ago.  He is not physically active.  He does not have compression stockings.  He does not routinely elevate his legs.  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 90 tablet 1   amLODIPine -Valsartan -HCTZ 10-160-25 MG TABS Take 1 tablet by mouth daily. 90 tablet 3   glucose blood (PRODIGY NO CODING BLOOD GLUC) test strip USE TO CHECK BLOOD SUGAR 3 TIMES DAILY 300 strip 2   Insulin  Pen Needle (PEN NEEDLES) 32G X 4 MM MISC Inject 35 Units as directed daily. To use with levemir  90 each 0   metFORMIN  (GLUCOPHAGE -XR) 500 MG 24 hr  tablet TAKE 2 TABLETS BY MOUTH TWICE DAILY WITH BREAKFAST AND WITH SUPPER 360 tablet 0   Multiple Vitamin (MULTIVITAMIN WITH MINERALS) TABS Take 1 tablet by mouth daily.     pioglitazone  (ACTOS ) 45 MG tablet Take 1 tablet (45 mg total) by mouth daily. 90 tablet 3   pravastatin  (PRAVACHOL ) 40 MG tablet Take 1 tablet by mouth once daily 100 tablet 0   sildenafil  (VIAGRA ) 100 MG tablet Take 1 tablet (100 mg total) by mouth as needed for erectile dysfunction. 30 tablet 2   TRESIBA  FLEXTOUCH 100 UNIT/ML FlexTouch Pen Inject 18 Units into the skin daily.     valsartan  (DIOVAN ) 160 MG tablet Take 1 tablet (160 mg total) by mouth daily. 90 tablet 3   Allergies No Known Allergies  Family History Family History  Problem Relation Age of Onset   Diabetes Mother    Hypertension Mother    Cancer Mother        pancreas   Heart failure Father    Diabetes Father    Hypertension Father    Diabetes Sister    Diabetes Brother     Review of Systems: Constitutional:  no fevers Eye:  no recent significant change in vision (pt is  blind) Ears:  No changes in hearing Nose/Mouth/Throat:  no complaints of nasal congestion, no sore throat Cardiovascular: no chest pain Respiratory:  No shortness of breath Gastrointestinal:  No change in bowel habits GU:  No frequency Integumentary:  no abnormal skin lesions reported Neurologic:  no headaches Endocrine:  denies unexplained weight changes  Exam BP 130/78 (BP Location: Left Arm, Patient Position: Sitting)   Pulse 66   Temp (!) 96.7 F (35.9 C) (Oral)   Resp 16   Ht 5' 11 (1.803 m)   Wt 260 lb (117.9 kg)   SpO2 98%   BMI 36.26 kg/m  General:  well developed, well nourished, in no apparent distress Skin:  no significant moles, warts, or growths Head:  no masses, lesions, or tenderness Eyes:  Sclera white, opacified lenses Ears:  canals without lesions, TMs shiny without retraction, no obvious effusion, no erythema Nose:  nares patent, mucosa  normal Throat/Pharynx:  lips and gingiva without lesion; tongue and uvula midline; non-inflamed pharynx; no exudates or postnasal drainage Lungs:  clear to auscultation, breath sounds equal bilaterally, no respiratory distress Cardio:  regular rate and rhythm, 3+ pitting bilateral lower extremity edema tapering at the proximal tibia. Rectal: Deferred GI: BS+, S, NT, ND, no masses or organomegaly Musculoskeletal:  symmetrical muscle groups noted without atrophy or deformity Neuro:  gait normal; deep tendon reflexes normal and symmetric Psych: well oriented with normal range of affect and appropriate judgment/insight  Assessment and Plan  Well adult exam  Type 2 diabetes mellitus in patient with obesity (HCC) - Plan: CBC, Comprehensive metabolic panel with GFR, Lipid panel, Hemoglobin A1c, Microalbumin / creatinine urine ratio  Type 2 diabetes mellitus with diabetic neuropathy, with long-term current use of insulin  (HCC) - Plan: venlafaxine  XR (EFFEXOR  XR) 37.5 MG 24 hr capsule, B12   Well 78 y.o. male. Counseled on diet and exercise. Advanced directive form requested today.  Flu shot today.  Paresthesias/neuropathy: Chronic, not controlled. Start Effexor  XR 37.5 mg/d.  Swelling: Elevation, mind salt intake, physical activity recommended, consider compression stockings OTC.  Check above labs. Other orders as above. Follow up in 6 mo.  The patient voiced understanding and agreement to the plan.  Mabel Mt [place], DO 05/13/24 7:30 AM

## 2024-05-13 NOTE — Addendum Note (Signed)
 Addended by: Symeon Puleo M on: 05/13/2024 07:42 AM   Modules accepted: Orders

## 2024-05-13 NOTE — Patient Instructions (Addendum)
 Give us  2-3 business days to get the results of your labs back.   Keep the diet clean and stay active.  Please get me a copy of your advanced directive form at your convenience.   For the swelling in your lower extremities, be sure to elevate your legs when able, mind the salt intake, stay physically active and consider wearing OTC compression stockings.  Let us  know if you need anything.

## 2024-05-14 ENCOUNTER — Ambulatory Visit: Payer: Self-pay | Admitting: Family Medicine

## 2024-05-14 LAB — MICROALBUMIN / CREATININE URINE RATIO
Creatinine,U: 94.8 mg/dL
Microalb Creat Ratio: 12.9 mg/g (ref 0.0–30.0)
Microalb, Ur: 1.2 mg/dL (ref 0.0–1.9)

## 2024-06-14 ENCOUNTER — Other Ambulatory Visit: Payer: Self-pay | Admitting: Family Medicine

## 2024-06-15 ENCOUNTER — Other Ambulatory Visit: Payer: Self-pay

## 2024-06-15 MED ORDER — PRAVASTATIN SODIUM 40 MG PO TABS: 40.0000 mg | ORAL_TABLET | Freq: Every day | ORAL | 0 refills | Status: AC

## 2024-06-15 MED ORDER — METFORMIN HCL ER 500 MG PO TB24: 500.0000 mg | ORAL_TABLET | Freq: Two times a day (BID) | ORAL | 0 refills | Status: DC

## 2024-07-18 ENCOUNTER — Other Ambulatory Visit: Payer: Self-pay | Admitting: Family Medicine

## 2024-07-21 ENCOUNTER — Telehealth: Payer: Self-pay | Admitting: Family Medicine

## 2024-07-21 ENCOUNTER — Telehealth: Payer: Self-pay | Admitting: *Deleted

## 2024-07-21 NOTE — Telephone Encounter (Signed)
 Called pt was advised mediation was sent, pt said he received it.

## 2024-07-21 NOTE — Telephone Encounter (Signed)
 Copied from CRM [id number]. Topic: Appointments - Scheduling Inquiry for Clinic >> Jul 21, 2024  9:35 AM Zy'onna H wrote: Reason for CRM: Patient called in to get his mediations for Hypertension refilled.  He stated they're were 3 total but 2 are under the same name.   He stated he was uncomfortable giving me any further information and wants to speak with the Nurse of his PCP Frann Mabel Mt, DO.   Please Advise

## 2024-07-21 NOTE — Telephone Encounter (Unsigned)
 Copied from CRM #[id number]. Topic: Clinical - Medication Refill >> Jul 21, 2024  9:26 AM Zy'onna H wrote: Medication:  1. amLODIPine -Valsartan -HCTZ 10-160-25 MG TABS 2. valsartan  (DIOVAN ) 160 MG tablet  ** Patient has requested 90 day supply** Has the patient contacted their pharmacy? No (Agent: If no, request that the patient contact the pharmacy for the refill. If patient does not wish to contact the pharmacy document the reason why and proceed with request.) (Agent: If yes, when and what did the pharmacy advise?)  This is the patient's preferred pharmacy:  [facility] [address] [place], [place] - [address] [place] [address] Phone: [phone] Fax: [phone]  Is this the correct pharmacy for this prescription? Yes If no, delete pharmacy and type the correct one.   Has the prescription been filled recently? Yes  Is the patient out of the medication? Yes  Has the patient been seen for an appointment in the last year OR does the patient have an upcoming appointment? Yes  Can we respond through MyChart? Yes  Agent: Please be advised that Rx refills may take up to 3 business days. We ask that you follow-up with your pharmacy.

## 2024-07-21 NOTE — Telephone Encounter (Signed)
 OPENED IN ERROR

## 2024-08-11 ENCOUNTER — Other Ambulatory Visit: Payer: Self-pay | Admitting: Family Medicine

## 2024-08-14 ENCOUNTER — Other Ambulatory Visit: Payer: Self-pay | Admitting: Family Medicine

## 2024-08-19 ENCOUNTER — Ambulatory Visit: Payer: Self-pay

## 2024-08-19 NOTE — Telephone Encounter (Signed)
 FYI Only or Action Required?: FYI only for provider: Home Care.  Patient was last seen in primary care on 05/13/2024 by Frann Mabel Mt, DO.  Called Nurse Triage reporting Cough.  Symptoms began several weeks ago.  Interventions attempted: OTC medications: Cough Syrup and Rest, hydration, or home remedies.  Symptoms are: gradually improving.  Triage Disposition: Home Care  Patient/caregiver understands and will follow disposition?: Yes     Message from [facility] C sent at 08/19/2024  2:43 PM EST   Reason for Triage: Patient called in stated he believes he has the cold or flu, stated he is coughing up some phlegm , wants to know if its okay

## 2024-08-19 NOTE — Telephone Encounter (Signed)
 This RN attempted x1 to contact patient, no answer, left voicemail message.           Message from [place] C sent at 08/19/2024  2:43 PM EST  Reason for Triage: Patient called in stated he believes he has the cold or flu, stated he is coughing up some phlegm , wants to know if its okay

## 2024-08-19 NOTE — Telephone Encounter (Signed)
" ° ° ° ° °  Message from [place] C sent at 08/19/2024  2:43 PM EST   Reason for Triage: Patient called in stated he believes he has the cold or flu, stated he is coughing up some phlegm , wants to know if its okay   Reason for Disposition  Cough  Answer Assessment - Initial Assessment Questions 1. ONSET: When did the cough begin?      Christmas Eve  2. SEVERITY: How bad is the cough today?      Lingering cough  3. SPUTUM: Describe the color of your sputum (e.g., none, dry cough; clear, white, yellow, green)     Coughing up phlegm every hour.  Not in chest, nothing wrong with chest. No postnasal drip. Phlegm unable to describe. (Mucus)  4. HEMOPTYSIS: Are you coughing up any blood? If Yes, ask: How much? (e.g., flecks, streaks, tablespoons, etc.)     Denies  5. DIFFICULTY BREATHING: Are you having difficulty breathing? If Yes, ask: How bad is it? (e.g., mild, moderate, severe)      Denies  6. FEVER: Do you have a fever? If Yes, ask: What is your temperature, how was it measured, and when did it start?     Denies fever or chills today  7. CARDIAC HISTORY: Do you have any history of heart disease? (e.g., heart attack, congestive heart failure)      Denies  8. LUNG HISTORY: Do you have any history of lung disease?  (e.g., pulmonary embolus, asthma, emphysema)     Denies  9. PE RISK FACTORS: Do you have a history of blood clots? (or: recent major surgery, recent prolonged travel, bedridden)     Denies  10. OTHER SYMPTOMS: Do you have any other symptoms? (e.g., runny nose, wheezing, chest pain)       Feels like coming up from stomach, feels kind of funny. Not heartburn.  Patient states multiple times just having cough, no other symptoms.    12. TRAVEL: Have you traveled out of the country in the last month? (e.g., travel history, exposures)       Denies  Protocols used: Cough - Acute Productive-A-AH  "

## 2024-08-19 NOTE — Telephone Encounter (Signed)
 Attempted to contact pt but NA. Lm for him to contact office. Attempt #2     Message from [place] C sent at 08/19/2024  2:43 PM EST  Reason for Triage: Patient called in stated he believes he has the cold or flu, stated he is coughing up some phlegm , wants to know if its okay

## 2024-08-27 ENCOUNTER — Ambulatory Visit: Payer: Self-pay

## 2024-08-27 NOTE — Telephone Encounter (Signed)
 Apt made for 08/31/2024

## 2024-08-27 NOTE — Telephone Encounter (Signed)
 FYI Only or Action Required?: FYI only for provider: appointment scheduled on 08/31/24.  Patient was last seen in primary care on 05/13/2024 by Jose Mabel Mt, DO.  Called Nurse Triage reporting Constipation.  Symptoms began several weeks ago.  Interventions attempted: OTC medications: mineral oil; metamucil; probiotic yogurt; increased fiber.  Symptoms are: unchanged.  Triage Disposition: See Physician Within 24 Hours  Patient/caregiver understands and will follow disposition?: Yes    Copied from CRM #[id number]. Topic: Clinical - Red Word Triage >> Aug 27, 2024  2:17 PM [name] wrote: [facility] that prompted transfer to Nurse Triage: constipated since christmas    Reason for Disposition  Last bowel movement (BM) > 4 days ago  Answer Assessment - Initial Assessment Questions Pt called to report worsening constipation since Christmas; last relief Sunday, 08/23/24. Pt states that he has tried mineral oil, metamucil, probiotic yogurt and has increased fiber/ water. Pt states he has had minimal relief with interventions. Pt states he has used mag citrate in the past but has not used this at this time. Discussed no abdominal pain but bloating is present. No fever, no hemorrhoids, no blood on stool. Discussed pt can try home care at this time and f/u with PCP. Pt agreeable. Appointment scheduled for evaluation. Patient agrees with plan of care, and will call back if anything changes, or if symptoms worsen.      1. STOOL PATTERN OR FREQUENCY: How often do you have a bowel movement (BM)?  (Normal range: 3 times a day to every 3 days)  When was your last BM?       Usually daily; last BM with relief, 08/23/24  2. STRAINING: Do you have to strain to have a BM?      Yes   3. ONSET: When did the constipation begin?     Since christmas   4. RECTAL PAIN: Does your rectum hurt when the stool comes out? If Yes, ask: Do you have hemorrhoids? How bad is the pain?  (Scale  1-10; or mild, moderate, severe)     Hx of hemorrhoids   5. BM COMPOSITION: Are the stools hard?      Yes   6. BLOOD ON STOOLS: Has there been any blood on the toilet tissue or on the surface of the BM? If Yes, ask: When was the last time?     No   7. CHRONIC CONSTIPATION: Is this a new problem for you?  If No, ask: How long have you had this problem? (days, weeks, months)      Yes; pt has used mag citrate in the past   8. CHANGES IN DIET OR HYDRATION: Have there been any recent changes in your diet? How much fluids are you drinking on a daily basis?  How much have you had to drink today?     Pt has been drinking more water   9. MEDICINES: Have you been taking any new medicines? Are you taking any narcotic pain medicines? (e.g., Dilaudid, morphine, Percocet, Vicodin)     No   10. LAXATIVES: Have you been using any stool softeners, laxatives, or enemas?  If Yes, ask What are you using, how often, and when was the last time?       Metamucil   11. ACTIVITY:  How much walking do you do every day?  Has your activity level decreased in the past week?        Minimal   12. CAUSE: What do you think is causing  the constipation?        Unsure   13. MEDICAL HISTORY: Do you have a history of hemorrhoids, rectal fissures, rectal surgery, or rectal abscess?         Hemorrhoids; no active lesions at this time   14. OTHER SYMPTOMS: Do you have any other symptoms? (e.g., abdomen pain, bloating, fever, vomiting)       Bloating  Protocols used: Constipation-A-AH

## 2024-08-31 ENCOUNTER — Ambulatory Visit: Admitting: Family Medicine
